# Patient Record
Sex: Female | Born: 1945 | Race: Black or African American | Hispanic: No | Smoking: Current some day smoker
Health system: Southern US, Community
[De-identification: ages and names within clinical notes are randomized; demographics above are authoritative.]

## PROBLEM LIST (undated history)

## (undated) DIAGNOSIS — S22000A Wedge compression fracture of unspecified thoracic vertebra, initial encounter for closed fracture: Secondary | ICD-10-CM

## (undated) DIAGNOSIS — D219 Benign neoplasm of connective and other soft tissue, unspecified: Secondary | ICD-10-CM

## (undated) DIAGNOSIS — F329 Major depressive disorder, single episode, unspecified: Secondary | ICD-10-CM

## (undated) DIAGNOSIS — E119 Type 2 diabetes mellitus without complications: Secondary | ICD-10-CM

## (undated) DIAGNOSIS — R102 Pelvic and perineal pain: Secondary | ICD-10-CM

## (undated) DIAGNOSIS — E049 Nontoxic goiter, unspecified: Secondary | ICD-10-CM

## (undated) DIAGNOSIS — F32A Depression, unspecified: Secondary | ICD-10-CM

## (undated) DIAGNOSIS — I10 Essential (primary) hypertension: Secondary | ICD-10-CM

## (undated) DIAGNOSIS — R1310 Dysphagia, unspecified: Secondary | ICD-10-CM

## (undated) HISTORY — DX: Essential (primary) hypertension: I10

## (undated) HISTORY — DX: Dysphagia, unspecified: R13.10

## (undated) HISTORY — PX: RIGHT OOPHORECTOMY: SHX2359

## (undated) HISTORY — PX: OTHER SURGICAL HISTORY: SHX169

## (undated) HISTORY — DX: Benign neoplasm of connective and other soft tissue, unspecified: D21.9

## (undated) HISTORY — DX: Depression, unspecified: F32.A

## (undated) HISTORY — DX: Type 2 diabetes mellitus without complications: E11.9

## (undated) HISTORY — DX: Wedge compression fracture of unspecified thoracic vertebra, initial encounter for closed fracture: S22.000A

## (undated) HISTORY — DX: Nontoxic goiter, unspecified: E04.9

## (undated) HISTORY — DX: Pelvic and perineal pain: R10.2

## (undated) HISTORY — DX: Major depressive disorder, single episode, unspecified: F32.9

---
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
## 1969-06-18 DIAGNOSIS — S22000A Wedge compression fracture of unspecified thoracic vertebra, initial encounter for closed fracture: Secondary | ICD-10-CM

## 1969-06-18 HISTORY — DX: Wedge compression fracture of unspecified thoracic vertebra, initial encounter for closed fracture: S22.000A

## 1999-10-19 HISTORY — PX: OTHER SURGICAL HISTORY: SHX169

## 2001-03-29 ENCOUNTER — Encounter: Payer: Self-pay | Admitting: Emergency Medicine

## 2001-03-29 ENCOUNTER — Emergency Department (HOSPITAL_COMMUNITY): Admission: EM | Admit: 2001-03-29 | Discharge: 2001-03-29 | Payer: Self-pay | Admitting: Emergency Medicine

## 2001-04-06 ENCOUNTER — Encounter: Payer: Self-pay | Admitting: Orthopedic Surgery

## 2001-04-07 ENCOUNTER — Inpatient Hospital Stay (HOSPITAL_COMMUNITY): Admission: RE | Admit: 2001-04-07 | Discharge: 2001-04-08 | Payer: Self-pay | Admitting: Orthopedic Surgery

## 2001-05-23 ENCOUNTER — Ambulatory Visit (HOSPITAL_COMMUNITY): Admission: RE | Admit: 2001-05-23 | Discharge: 2001-05-23 | Payer: Self-pay | Admitting: Orthopedic Surgery

## 2001-05-23 ENCOUNTER — Encounter: Payer: Self-pay | Admitting: Orthopedic Surgery

## 2003-11-18 ENCOUNTER — Other Ambulatory Visit: Admission: RE | Admit: 2003-11-18 | Discharge: 2003-11-18 | Payer: Self-pay | Admitting: Obstetrics and Gynecology

## 2004-07-20 ENCOUNTER — Emergency Department (HOSPITAL_COMMUNITY): Admission: EM | Admit: 2004-07-20 | Discharge: 2004-07-21 | Payer: Self-pay | Admitting: Emergency Medicine

## 2005-06-08 ENCOUNTER — Emergency Department (HOSPITAL_COMMUNITY): Admission: EM | Admit: 2005-06-08 | Discharge: 2005-06-08 | Payer: Self-pay | Admitting: Emergency Medicine

## 2006-02-10 ENCOUNTER — Encounter: Admission: RE | Admit: 2006-02-10 | Discharge: 2006-02-10 | Payer: Self-pay | Admitting: Family Medicine

## 2006-02-23 ENCOUNTER — Other Ambulatory Visit: Admission: RE | Admit: 2006-02-23 | Discharge: 2006-02-23 | Payer: Self-pay | Admitting: Family Medicine

## 2006-02-23 LAB — HM PAP SMEAR: HM Pap smear: NORMAL

## 2006-02-24 ENCOUNTER — Encounter (HOSPITAL_COMMUNITY): Admission: RE | Admit: 2006-02-24 | Discharge: 2006-05-25 | Payer: Self-pay | Admitting: Family Medicine

## 2006-03-30 ENCOUNTER — Encounter: Admission: RE | Admit: 2006-03-30 | Discharge: 2006-03-30 | Payer: Self-pay | Admitting: Family Medicine

## 2011-06-24 ENCOUNTER — Encounter: Payer: Self-pay | Admitting: Family Medicine

## 2011-11-16 ENCOUNTER — Emergency Department (INDEPENDENT_AMBULATORY_CARE_PROVIDER_SITE_OTHER)
Admission: EM | Admit: 2011-11-16 | Discharge: 2011-11-16 | Disposition: A | Discharge status: Home / Self Care | Payer: Medicare Other | Source: Home / Self Care | Attending: Family Medicine | Admitting: Family Medicine

## 2011-11-16 ENCOUNTER — Encounter (HOSPITAL_COMMUNITY): Payer: Self-pay | Admitting: *Deleted

## 2011-11-16 ENCOUNTER — Other Ambulatory Visit: Payer: Self-pay

## 2011-11-16 ENCOUNTER — Emergency Department (INDEPENDENT_AMBULATORY_CARE_PROVIDER_SITE_OTHER): Payer: Medicare Other

## 2011-11-16 DIAGNOSIS — R079 Chest pain, unspecified: Secondary | ICD-10-CM

## 2011-11-16 DIAGNOSIS — R0789 Other chest pain: Secondary | ICD-10-CM

## 2011-11-16 NOTE — ED Provider Notes (Signed)
Brooke Wilcox is a 66 y.o. female who presents to Urgent Care today for sternal pain. She fell and landed on her thoracic back a few days ago and noted initially mid thoracic back pain and sternal pain. Over the next few days her back pain resolved as did her sternal pain. However she called today and noted resumption of her sternal pain. She notes pain when she presses on her sternum and when she externally rotates both shoulders.  She denies any lightheadedness fevers chills dyspnea palpitations or substernal chest pain or radiating chest pain.   PMH reviewed. Patient is a smoker ROS as above otherwise neg Medications reviewed. No current facility-administered medications for this encounter.   Current Outpatient Prescriptions  Medication Sig Dispense Refill  . gabapentin (NEURONTIN) 300 MG capsule Take 300 mg by mouth 3 (three) times daily.      Marland Kitchen lisinopril (PRINIVIL,ZESTRIL) 20 MG tablet Take 20 mg by mouth daily.      . metFORMIN (GLUCOPHAGE) 500 MG tablet Take 500 mg by mouth 2 (two) times daily with a meal.      (she is not taking any medicines currently)  Exam:  BP 187/149  Pulse 88  Temp(Src) 99.4 F (37.4 C) (Oral)  Resp 16  SpO2 98% Gen: Well NAD HEENT: EOMI,  MMM Lungs: CTABL Nl WOB Chest: Tender to palpation on superior sternum. Ribs nontender. Heart: RRR no MRG Abd: NABS, NT, ND Exts: Non edematous BL  LE, warm and well perfused.   CXR: No new acute findings or fractures EKG: Sinus at 84 bpm with no significant ST segment abnormalities.  Assessment and Plan: 66 year old woman sternal pain following fall.  No acute findings on physical exam or workup. Her pain is likely do to costochondritis type pain.  However she is 74 does smoke and has poorly treated blood pressure.  I strongly encouraged her to followup with her primary care doctor for further evaluation.  If she is not improved I asked her to return back here her primary care doctor or the emergency room.  I  discussed red flag signs or symptoms in detail such as chest pain or trouble breathing. The patient expresses understanding.     [name], MD 11/16/11 2021

## 2011-11-16 NOTE — ED Notes (Signed)
Larey Seat    Down  Some  Steps  A  Few  Days  Ago   She  Has  Pain upper  Back  And      l  Side  Chest  Area        Hurts  On palpation  And  Certain movements           Her bp  Is  Also  High  And she     Has been  Non  complient     With  Her meds

## 2011-11-17 NOTE — ED Provider Notes (Signed)
Medical screening examination/treatment/procedure(s) were performed by PGY-3 FM resident and as supervising physician I was immediately available for consultation/collaboration.   Sharin Grave, MD   Sharin Grave, MD 11/17/11 [phone]

## 2012-11-13 ENCOUNTER — Telehealth: Payer: Self-pay | Admitting: Family Medicine

## 2012-11-13 NOTE — Telephone Encounter (Signed)
Left a message for pt to return call. Additional records MUST be received before appointment. The records received stop at 2007. Pt needs to inform us as to who has been treating her since 2007 and those records need to be ordered. Appointment on Feb 10 must be rescheduled, too many cpe that day.

## 2012-11-22 NOTE — Telephone Encounter (Signed)
PT CALLED AND THEN STOPPED BY TO FILL OUT ADDITIONAL RECORDS REQUEST. REQUEST WAS COMPLETED AND SENT TO PREVIOUS DOC. APPT FOR Monday 11/27/2012 WAS CANCELLED. PT WAS INFORMED THAT WHEN HER RECORDS COME IN WE WILL CALL AND SET UP APPOINTMENT

## 2012-11-23 ENCOUNTER — Encounter: Payer: Self-pay | Admitting: *Deleted

## 2012-11-27 ENCOUNTER — Ambulatory Visit: Payer: Medicare Other | Admitting: Family Medicine

## 2012-12-06 ENCOUNTER — Encounter: Payer: Self-pay | Admitting: *Deleted

## 2012-12-14 ENCOUNTER — Ambulatory Visit (INDEPENDENT_AMBULATORY_CARE_PROVIDER_SITE_OTHER): Payer: Medicare Other | Admitting: Family Medicine

## 2012-12-14 ENCOUNTER — Telehealth: Payer: Self-pay | Admitting: *Deleted

## 2012-12-14 ENCOUNTER — Encounter: Payer: Self-pay | Admitting: Family Medicine

## 2012-12-14 VITALS — BP 214/118 | HR 80 | Ht 62.0 in | Wt 155.0 lb

## 2012-12-14 DIAGNOSIS — Z23 Encounter for immunization: Secondary | ICD-10-CM

## 2012-12-14 DIAGNOSIS — I10 Essential (primary) hypertension: Secondary | ICD-10-CM

## 2012-12-14 DIAGNOSIS — E1149 Type 2 diabetes mellitus with other diabetic neurological complication: Secondary | ICD-10-CM

## 2012-12-14 DIAGNOSIS — N950 Postmenopausal bleeding: Secondary | ICD-10-CM

## 2012-12-14 DIAGNOSIS — E1140 Type 2 diabetes mellitus with diabetic neuropathy, unspecified: Secondary | ICD-10-CM

## 2012-12-14 DIAGNOSIS — E1142 Type 2 diabetes mellitus with diabetic polyneuropathy: Secondary | ICD-10-CM

## 2012-12-14 DIAGNOSIS — IMO0001 Reserved for inherently not codable concepts without codable children: Secondary | ICD-10-CM

## 2012-12-14 LAB — COMPREHENSIVE METABOLIC PANEL
ALT: 13 U/L (ref 0–35)
Albumin: 4.5 g/dL (ref 3.5–5.2)
BUN: 10 mg/dL (ref 6–23)
CO2: 27 mEq/L (ref 19–32)
Creat: 0.6 mg/dL (ref 0.50–1.10)
Glucose, Bld: 244 mg/dL — ABNORMAL HIGH (ref 70–99)
Potassium: 4.3 mEq/L (ref 3.5–5.3)
Total Bilirubin: 0.4 mg/dL (ref 0.3–1.2)
Total Protein: 7.1 g/dL (ref 6.0–8.3)

## 2012-12-14 LAB — CBC WITH DIFFERENTIAL/PLATELET
Basophils Absolute: 0 10*3/uL (ref 0.0–0.1)
HCT: 39.5 % (ref 36.0–46.0)
Lymphs Abs: 3.4 10*3/uL (ref 0.7–4.0)
MCH: 28.7 pg (ref 26.0–34.0)
MCHC: 33.9 g/dL (ref 30.0–36.0)
MCV: 84.6 fL (ref 78.0–100.0)
Monocytes Relative: 5 % (ref 3–12)
Platelets: 290 10*3/uL (ref 150–400)
RBC: 4.67 MIL/uL (ref 3.87–5.11)
RDW: 14.7 % (ref 11.5–15.5)
WBC: 7.7 10*3/uL (ref 4.0–10.5)

## 2012-12-14 LAB — TSH: TSH: 0.621 u[IU]/mL (ref 0.350–4.500)

## 2012-12-14 LAB — POCT GLYCOSYLATED HEMOGLOBIN (HGB A1C): Hemoglobin A1C: 9.6

## 2012-12-14 MED ORDER — METFORMIN HCL 500 MG PO TABS: 1000.0000 mg | ORAL_TABLET | Freq: Two times a day (BID) | ORAL | Status: DC

## 2012-12-14 MED ORDER — PIOGLITAZONE HCL 15 MG PO TABS: 15.0000 mg | ORAL_TABLET | Freq: Every day | ORAL | Status: AC

## 2012-12-14 MED ORDER — LISINOPRIL-HYDROCHLOROTHIAZIDE 20-25 MG PO TABS: 1.0000 | ORAL_TABLET | Freq: Every day | ORAL | Status: DC

## 2012-12-14 MED ORDER — AMLODIPINE BESYLATE 10 MG PO TABS: 10.0000 mg | ORAL_TABLET | Freq: Every day | ORAL | Status: DC

## 2012-12-14 MED ORDER — GABAPENTIN 300 MG PO CAPS: 300.0000 mg | ORAL_CAPSULE | Freq: Three times a day (TID) | ORAL | Status: DC

## 2012-12-14 NOTE — Telephone Encounter (Signed)
Called patient and let her know that she is scheduled with Dr.Morris @ [facility] (GYN) on December 26, 2012 @ 11:40am. They are located at [address]nd floor. Records faxed to [phone].

## 2012-12-14 NOTE — Progress Notes (Signed)
Chief Complaint  Patient presents with  . Establish Care    re-establish care with Dr.Devaney Segers today.   Hasn't seen a doctor in over 2 years (last was in [place], [place]).  Admits to being noncompliant with her meds. She is here today because she is now having some symptoms, so is more motivated to take care of herself.  She reports having a problem swallowing pills, and certain foods.  She had been put on neurontin, which was helping a lot with her neuropathy.  She actually has still been taking this (getting from daughter), with improvement.  neurontin made her a little sleepy--does well with taking 600mg  qHS.  +polyuria and intermittent polydipsia  Drinking a lot of regular Coke, eating chips, eating candy Has never been to an eye doctor Has silver Sneaker program available to her, but hasn't started yet.  Hyperlipidemia.  She reports never taking a statin.  She was rx'd Lovaza, but never started it.  Prefers to take OTC fish oil.  Her life is "a mess right now".  Staying in a room in someone else's house.  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  Unable to work in [place], but still has license in [place] (worked at a bad place that caused a problem for her, "caught up in a mess").  Smoking--started late in life, and is very sporadic.  Sometimes goes weeks without smoking, sometimes smokes all day long.  Has been able to quit smoking in the past when she wanted to.  Past Medical History  Diagnosis Date  . Hypertension   . Diabetes mellitus without complication   . Dysphagia   . Pelvic pain   . Fibroids     uterine  . Depression   . Compression fracture of thoracic vertebra 1970's    T7  . Goiter     left side, per pt, which contributes to her dysphagia   Past Surgical History  Procedure Laterality Date  . Bone chip removal from l knee    . Right oophorectomy    . Right wrist fracture post orif   2001  . Ovarian cyst removal  1960's   History   Social History  . Marital Status:  Divorced    Spouse Name: N/A    Number of Children: 3  . Years of Education: N/A   Occupational History  . unemployed    Social History Main Topics  . Smoking status: Current Some Day Smoker    Types: Cigarettes  . Smokeless tobacco: Not on file     Comment: sporadic smoking, sometimes daily  . Alcohol Use: Yes     Comment: occasional glass of wine.  . Drug Use: No  . Sexually Active: Not on file   Other Topics Concern  . Not on file   Social History Narrative   Divorced.  Staying in a room in someone else's house. Her children all live in [place].  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         Family History  Problem Relation Age of Onset  . Diabetes Mother     borderline  . Breast cancer Mother 51    late 88's  . Hypertension Mother   . Hypothyroidism Mother   . Cancer Father   . Hypertension Brother   . Cancer Daughter     throat cancer  . Lupus Daughter   . Hypertension Brother   . Crohn's disease Sister   .  Hypertension Son   . Diabetes Paternal Aunt   . Cancer Paternal Uncle     lung  . Hypertension Daughter    Current outpatient prescriptions:cholecalciferol (VITAMIN D) 1000 UNITS tablet, Take 1,000 Units by mouth daily., Disp: , Rfl: ;  gabapentin (NEURONTIN) 300 MG capsule, Take 1 capsule (300 mg total) by mouth 3 (three) times daily., Disp: 90 capsule, Rfl: 2;  amLODipine (NORVASC) 10 MG tablet, Take 1 tablet (10 mg total) by mouth daily. Start at 1/2 tablet, and increase to full tablet after a week if BP still >140/90, Disp: 30 tablet, Rfl: 2 lisinopril-hydrochlorothiazide (PRINZIDE,ZESTORETIC) 20-25 MG per tablet, Take 1 tablet by mouth daily., Disp: 30 tablet, Rfl: 2;  metFORMIN (GLUCOPHAGE) 500 MG tablet, Take 2 tablets (1,000 mg total) by mouth 2 (two) times daily with a meal., Disp: 120 tablet, Rfl: 2;  pioglitazone (ACTOS) 15 MG tablet, Take 1 tablet (15 mg total) by mouth daily., Disp: 30 tablet, Rfl: 2  Allergies   Allergen Reactions  . Codeine Hives    And SOB  . Latex Rash    And dizziness.   ROS: Currently denies headache, chest pain. Occasionally gets headaches.  Occasionally gets chest pain, sometimes at rest. Denies GI or GU complaints.  Denies fevers, URI symptoms, cough, shortness of breath. Sometimes has some vaginal bleeding (pink-tinged discharge).  Has known fibroid. Doesn't have a GYN. Denies skin rashes/lesions.  Some numbness/tingling in feet.  She checks feet regularly.  Denies bruising, bleeding (other than tings in vaginal discharge).  PHYSICAL EXAM: BP 220/130  Pulse 80  Ht 5\' 2"  (1.575 m)  Wt 155 lb (70.308 kg)  BMI 28.34 kg/m2 214/118 on repeat by MD Very pleasant, african [place] female, in no distress Wearing tights under pants, declined diabetic foot exam--will do at next visit. HEENT:  PERRL, EOMI, conjunctiva clear.  OP clear.  Neck: no lymphadenopathy, thyromegaly or mass, no carotid bruit Heart: regular rate and rhythm without murmur Lungs: clear bilaterally Abdomen: soft, nontender, no organomegaly or mass Extremities: no edema, 2+ pulse Skin: no rashes (feet not checked) Neuro: alert and oriented.  Cranial nerves grossly intact. Normal gait, sensation.  DTR's 2+ and symmetric Psych: normal mood, affect, hygiene and grooming.  ASSESSMENT/PLAN:  Type II or unspecified type diabetes mellitus without mention of complication, uncontrolled - Plan: Comprehensive metabolic panel, TSH, Microalbumin / creatinine urine ratio, CBC with Differential, metFORMIN (GLUCOPHAGE) 500 MG tablet, pioglitazone (ACTOS) 15 MG tablet, HgB A1c  Uncontrolled hypertension - Plan: Comprehensive metabolic panel, CBC with Differential, lisinopril-hydrochlorothiazide (PRINZIDE,ZESTORETIC) 20-25 MG per tablet, amLODipine (NORVASC) 10 MG tablet  Diabetic neuropathy, type II diabetes mellitus - Plan: gabapentin (NEURONTIN) 300 MG capsule  Postmenopausal bleeding - Plan: Ambulatory referral  to Gynecology  Need for prophylactic vaccination and inoculation against influenza - Plan: Flu vaccine greater than or equal to 3yo preservative free IM  Smoker--risks of smoking reviewed and encouraged to quit.  Advised of available resources  Flu shot today DM--uncontrolled. HTN--uncontrolled. Reviewed longterm risks and complications of untreated hypertension, diabetes.  Advised to schedule yearly diabetic eye exam. Encouraged her to be compliant with her treatment.  Reviewed barriers to treatment, including costs, and trouble swallowing large pills.  Tried to address these barriers.  Advised that if she continues to be noncompliant in her care, that she could potentially be discharged from the clinic.  She seems more motivated to improve health now, but history shows a lot of noncompliance.   Follow a low cholesterol diet.  Since lovaza  was previously prescribed, I suggested trying fish oil would a good idea--3000-4000 mg daily. She isn't fasting today, so lipids not drawn. Stop drinking regular sodas, eating sweets.  Eat a healthy, lowfat diet.  Return in 1 month with a list of blood pressures. Start checking your blood sugars and bring in list to your visit. Return FASTING so that we can check your cholesterol at your visit. Your feet will need to be checked (don't wear tights!) Visit time 45-50 minutes, more than 1/2 spent counseling.  Return in 4-6 weeks for a fasting visit (order lipids at f/u)

## 2012-12-14 NOTE — Patient Instructions (Addendum)
Please quit smoking.  It is helpful to set a quit date.  Utilize the free counseling available (1800QUITNOW or [facility]uit[facility] It is important that you take your medications regularly. Please schedule a diabetic eye exam--need to have yearly if no problems found Please check your blood pressures periodically, write down the numbers on a piece of paper, and bring to your visits with you.     Start lisinopril HCT every day (take in mornings). Start the amlodipine at just 1/2 tablet daily.  After a week if your blood pressure is still over 140/90, then increase to full tablet. Start the pioglitazone (actos) once daily Start back on the metformin--start at 500mg  twice daily prior to breakfast and dinner, and increase weekly by 1 tablet until you are at 1000 mg twice daily (2 tablet twice daily, 4 pills/day).  This should help minimize the stomach side effects.  Continue the neurontin as you have been taking.  You can change it to 1 pill three times daily if you aren't getting adequate relief in taking 2 at bedtime like you have been  Follow a low cholesterol diet.  Since lovaza was previously prescribed, I'm guessing that fish oil would a good idea--3000-4000 mg daily. Stop drinking regular sodas, eating sweets.  Eat a healthy, lowfat diet.  Return in 1 month with a list of blood pressure. Start checking your blood sugars and bring in list to your visit. Return FASTING so that we can check your cholesterol at your visit. Your feet will need to be checked (don't wear tights!)

## 2012-12-15 ENCOUNTER — Encounter: Payer: Self-pay | Admitting: Family Medicine

## 2012-12-15 LAB — MICROALBUMIN / CREATININE URINE RATIO: Microalb Creat Ratio: 13.5 mg/g (ref 0.0–30.0)

## 2013-01-24 ENCOUNTER — Encounter: Payer: Medicare Other | Admitting: Family Medicine

## 2013-01-29 ENCOUNTER — Telehealth: Payer: Self-pay | Admitting: *Deleted

## 2013-01-29 NOTE — Telephone Encounter (Signed)
Patient called and left a message on my voicemail wanting to let you know that she missed her appt last week with you due to a family emergency, she had to unexpectedly go to [place]. She also said that she was referred to see Dr.Grewal by you buy they don't take her insurance so she would like to be referred to Antionette Char at [facility]. I called over to Dr.Grewal's office as I scheduled appointment with Dr.Morris in that same office, wanted to check on the insurance issue. Patient was scheduled with Dr.Morris for 12/26/12 but patient no showed. They do take her insurance also. Do you want me to refer to Dr.Jackson-Moore? Please advise. Also patient r/s'd appt with you from last week to end of July.

## 2013-01-29 NOTE — Telephone Encounter (Signed)
Ok to refer to Dr. Tamela Oddi.  Indiscrepancy of story noted.

## 2013-01-31 ENCOUNTER — Telehealth: Payer: Self-pay | Admitting: *Deleted

## 2013-01-31 NOTE — Telephone Encounter (Signed)
Called patient and left message to let her know that Dr.Jackson-Moore is not taking any new patients except for pregnancy patients. Dr.Harper in the same practice is taking new patients and does take her insurance. Scheduled patient with Dr.Harper for 02/19/13 @ 2:30pm. They are located at [address] Floor, [address] and their phone number is [phone].

## 2013-02-19 ENCOUNTER — Ambulatory Visit (INDEPENDENT_AMBULATORY_CARE_PROVIDER_SITE_OTHER): Payer: Medicare Other | Admitting: Obstetrics

## 2013-02-19 ENCOUNTER — Encounter: Payer: Self-pay | Admitting: Obstetrics

## 2013-02-19 VITALS — BP 148/96 | HR 102 | Temp 98.7°F | Ht 64.0 in | Wt 151.2 lb

## 2013-02-19 DIAGNOSIS — N760 Acute vaginitis: Secondary | ICD-10-CM | POA: Insufficient documentation

## 2013-02-19 DIAGNOSIS — D259 Leiomyoma of uterus, unspecified: Secondary | ICD-10-CM

## 2013-02-19 DIAGNOSIS — Z1231 Encounter for screening mammogram for malignant neoplasm of breast: Secondary | ICD-10-CM

## 2013-02-19 DIAGNOSIS — Z0000 Encounter for general adult medical examination without abnormal findings: Secondary | ICD-10-CM

## 2013-02-19 DIAGNOSIS — Z1211 Encounter for screening for malignant neoplasm of colon: Secondary | ICD-10-CM

## 2013-02-19 DIAGNOSIS — Z01419 Encounter for gynecological examination (general) (routine) without abnormal findings: Secondary | ICD-10-CM

## 2013-02-19 NOTE — Progress Notes (Signed)
.   Subjective:     Brooke Wilcox is a 67 y.o. female here for a routine exam.  Current complaints: pt states that she had a little spotting after douching. She has a history of fibroids.  Personal health questionnaire reviewed: yes.   Gynecologic History No LMP recorded. Patient is postmenopausal. Contraception: none Last Pap: 2010. Results were: normal Last mammogram: 2010. Results were normal.  Obstetric History OB History   Grav Para Term Preterm Abortions TAB SAB Ect Mult Living                   The following portions of the patient's history were reviewed and updated as appropriate: allergies, current medications, past family history, past medical history, past social history, past surgical history and problem list.  Review of Systems Pertinent items are noted in HPI.    Objective:    General appearance: alert and no distress Breasts: normal appearance, no masses or tenderness Abdomen: normal findings: soft, non-tender Pelvic: NEFG.  Vagina with frothy, pinkish discharge.  Cervix stenosed os, friable and bled easily with pap.  Uterus difficult to appreciate due to obesity.  Adnexa negative for masses or tenderness.   Assessment:    Annual exam Trichomonas vaginalis   Plan:    Education reviewed: self breast exams, weight bearing exercise and Trichomanas.. Mammogram ordered. Follow up in: 2 weeks. Postmenopausal bleeding probably related to Trichomonas infecton   Tindamax Rx.

## 2013-02-20 NOTE — Patient Instructions (Addendum)
Trichomonas Fibroids

## 2013-02-27 ENCOUNTER — Encounter: Payer: Self-pay | Admitting: Family Medicine

## 2013-03-02 ENCOUNTER — Encounter: Payer: Self-pay | Admitting: Gastroenterology

## 2013-03-05 ENCOUNTER — Telehealth: Payer: Self-pay | Admitting: Internal Medicine

## 2013-03-05 DIAGNOSIS — I10 Essential (primary) hypertension: Secondary | ICD-10-CM

## 2013-03-05 DIAGNOSIS — E1140 Type 2 diabetes mellitus with diabetic neuropathy, unspecified: Secondary | ICD-10-CM

## 2013-03-05 MED ORDER — METFORMIN HCL 500 MG PO TABS: 1000.0000 mg | ORAL_TABLET | Freq: Two times a day (BID) | ORAL | Status: AC

## 2013-03-05 MED ORDER — GABAPENTIN 300 MG PO CAPS: 300.0000 mg | ORAL_CAPSULE | Freq: Three times a day (TID) | ORAL | Status: DC

## 2013-03-05 MED ORDER — AMLODIPINE BESYLATE 10 MG PO TABS: 10.0000 mg | ORAL_TABLET | Freq: Every day | ORAL | Status: AC

## 2013-03-05 MED ORDER — LISINOPRIL-HYDROCHLOROTHIAZIDE 20-25 MG PO TABS: 1.0000 | ORAL_TABLET | Freq: Every day | ORAL | Status: AC

## 2013-03-05 NOTE — Telephone Encounter (Signed)
Done

## 2013-03-05 NOTE — Telephone Encounter (Signed)
Pt needs metformin,lisinopril-hctz, gabpentin, and norvasc all sent to her new pharmacy [facility]

## 2013-03-13 ENCOUNTER — Other Ambulatory Visit: Payer: Self-pay | Admitting: Obstetrics

## 2013-03-13 ENCOUNTER — Ambulatory Visit (HOSPITAL_COMMUNITY)
Admission: RE | Admit: 2013-03-13 | Discharge: 2013-03-13 | Disposition: A | Discharge status: Home / Self Care | Payer: Medicare Other | Source: Ambulatory Visit | Attending: Obstetrics | Admitting: Obstetrics

## 2013-03-13 DIAGNOSIS — D259 Leiomyoma of uterus, unspecified: Secondary | ICD-10-CM

## 2013-03-13 DIAGNOSIS — R9389 Abnormal findings on diagnostic imaging of other specified body structures: Secondary | ICD-10-CM | POA: Insufficient documentation

## 2013-03-13 DIAGNOSIS — Z780 Asymptomatic menopausal state: Secondary | ICD-10-CM | POA: Insufficient documentation

## 2013-03-19 ENCOUNTER — Ambulatory Visit (INDEPENDENT_AMBULATORY_CARE_PROVIDER_SITE_OTHER): Payer: Medicare Other | Admitting: Family Medicine

## 2013-03-19 ENCOUNTER — Encounter: Payer: Self-pay | Admitting: Family Medicine

## 2013-03-19 VITALS — BP 136/80 | HR 80 | Ht 62.0 in | Wt 149.0 lb

## 2013-03-19 DIAGNOSIS — E785 Hyperlipidemia, unspecified: Secondary | ICD-10-CM

## 2013-03-19 DIAGNOSIS — I10 Essential (primary) hypertension: Secondary | ICD-10-CM

## 2013-03-19 DIAGNOSIS — E1142 Type 2 diabetes mellitus with diabetic polyneuropathy: Secondary | ICD-10-CM

## 2013-03-19 DIAGNOSIS — E1140 Type 2 diabetes mellitus with diabetic neuropathy, unspecified: Secondary | ICD-10-CM

## 2013-03-19 DIAGNOSIS — E1149 Type 2 diabetes mellitus with other diabetic neurological complication: Secondary | ICD-10-CM

## 2013-03-19 DIAGNOSIS — E781 Pure hyperglyceridemia: Secondary | ICD-10-CM

## 2013-03-19 DIAGNOSIS — Z79899 Other long term (current) drug therapy: Secondary | ICD-10-CM

## 2013-03-19 LAB — LIPID PANEL
Cholesterol: 286 mg/dL — ABNORMAL HIGH (ref 0–200)
HDL: 42 mg/dL (ref >39)
Total CHOL/HDL Ratio: 6.8 Ratio
Triglycerides: 688 mg/dL — ABNORMAL HIGH (ref <150)

## 2013-03-19 LAB — BASIC METABOLIC PANEL
BUN: 16 mg/dL (ref 6–23)
CO2: 27 mEq/L (ref 19–32)
Calcium: 9.7 mg/dL (ref 8.4–10.5)
Creat: 0.78 mg/dL (ref 0.50–1.10)
Glucose, Bld: 264 mg/dL — ABNORMAL HIGH (ref 70–99)

## 2013-03-19 LAB — POCT GLYCOSYLATED HEMOGLOBIN (HGB A1C): Hemoglobin A1C: 11.1

## 2013-03-19 NOTE — Patient Instructions (Addendum)
Start taking 1 in the morning, and 2 with dinner.  After a week, if feeling okay, increase to 2 tablets twice daily.  If after a week, your sugars are still >140-150 fasting, then add the Actos in.   Hopefully you will tolerate a more gradual increase in starting the diabetes medications.  Continue to monitor sugars regularly.  Work on cutting back on sodas and sweets and carbs in diet.  Try and exercise daily which also keeps sugars down.  The risks of poorly controlled diabetes are worse than the potential risks with Actos.  There are other meds which are more expensive, but I think this is a reasonable choice for treatment (using Actos in addition to Metformin).   Continue to monitor blood pressure.  Bring list of sugars and blood pressures to all visits.  Schedule diabetic eye exam.

## 2013-03-19 NOTE — Progress Notes (Signed)
Chief Complaint  Patient presents with  . Diabetes    fasting med check. Never took actos, did fill rx but became scared when she saw commercial on tv about the medication.    Patient presents to follow up on her blood pressure and diabetes.   Hypertension follow-up:  Blood pressures elsewhere are 120's-130's/85-90. She didn't remember to bring in list of BP's, this was her recollection.  Denies dizziness, headaches, chest pain.  Denies side effects of medications.  Only occasional cough.  Diabetes follow-up:  Blood sugars at home are running 170-212.  Only occasionally has gotten it around 140. States she almost passed out a couple of times, felt like sugar was low.  Eating candy helped her feel better.  She was out, and didn't have machine with her to check sugars when symptomatic. Denies polydipsia and polyuria.  Eye exam is still not yet scheduled.  Patient follows a low sugar diet and checks feet regularly without concerns.  She states she has been eating healthier.  Cutting back on drinking regular sodas.  She is taking only metformin 500mg  BID (not 1000mg  as instructed), and feels better--felt too washed out on the 1000mg  dose.  She never started the Actos--afraid of risks (commercial on TV). "I'm getting more compliant" Hasn't started exercise program just yet.  Neuropathy is stable, improved with neurontin 3/day.  Past Medical History  Diagnosis Date  . Hypertension   . Diabetes mellitus without complication   . Dysphagia   . Pelvic pain   . Fibroids     uterine  . Depression   . Compression fracture of thoracic vertebra 1970's    T7  . Goiter     left side, per pt, which contributes to her dysphagia   Past Surgical History  Procedure Laterality Date  . Bone chip removal from l knee    . Right oophorectomy    . Right wrist fracture post orif   2001  . Ovarian cyst removal  1960's   History   Social History  . Marital Status: Divorced    Spouse Name: N/A    Number  of Children: 3  . Years of Education: N/A   Occupational History  . unemployed    Social History Main Topics  . Smoking status: Current Some Day Smoker    Types: Cigarettes  . Smokeless tobacco: Not on file     Comment: sporadic smoking, sometimes daily  . Alcohol Use: Yes     Comment: occasional glass of wine.  . Drug Use: No  . Sexually Active: Not on file   Other Topics Concern  . Not on file   Social History Narrative   Divorced.  Staying in a room in someone else's house. Her children all live in [place].  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         Current Outpatient Prescriptions on File Prior to Visit  Medication Sig Dispense Refill  . amLODipine (NORVASC) 10 MG tablet Take 1 tablet (10 mg total) by mouth daily. Start at 1/2 tablet, and increase to full tablet after a week if BP still >140/90  90 tablet  0  . cholecalciferol (VITAMIN D) 1000 UNITS tablet Take 1,000 Units by mouth daily.      Marland Kitchen gabapentin (NEURONTIN) 300 MG capsule Take 1 capsule (300 mg total) by mouth 3 (three) times daily.  270 capsule  0  . lisinopril-hydrochlorothiazide (PRINZIDE,ZESTORETIC) 20-25 MG per tablet Take 1 tablet  by mouth daily.  90 tablet  0  . metFORMIN (GLUCOPHAGE) 500 MG tablet Take 2 tablets (1,000 mg total) by mouth 2 (two) times daily with a meal.  360 tablet  0  . pioglitazone (ACTOS) 15 MG tablet Take 1 tablet (15 mg total) by mouth daily.  30 tablet  2   No current facility-administered medications on file prior to visit.   (only taking 500mg  BID of metformin, NOT taking Actos)  Allergies  Allergen Reactions  . Codeine Hives    And SOB  . Latex Rash    And dizziness.   ROS:  Denies headaches, dizziness, chest pain, edema, myalgias, URI symptoms, shortness of breath, fevers, urinary complaints, myalgias, depression or other concerns.  See HPI.  Neuropathy improved.  PHYSICAL EXAM: BP 132/90  Pulse 80  Ht 5\' 2"  (1.575 m)  Wt 149 lb  (67.586 kg)  BMI 27.25 kg/m2 136/80 on repeat by MD, RA Well developed, pleasant female in no distress Neck: no lymphadenopathy, thyromegaly or carotid bruit Heart: regular rate and rhythm without murmur Lungs: clear bilaterally Abdomen: soft, nontender Extremities: no edema, 2+ pulse.   Normal diabetic foot exam Neuro: alert and oriented.  CN grossly intact. Normal gait, strength, sensation Psych: normal mood, affect, hygiene and grooming  Lab Results  Component Value Date   HGBA1C 11.1 03/19/2013   ASSESSMENT/PLAN: Type II or unspecified type diabetes mellitus without mention of complication, uncontrolled - Plan: HgB A1c, Basic metabolic panel, HM Diabetes Foot Exam  Dyslipidemia - Plan: Lipid panel  Encounter for long-term (current) use of other medications - Plan: Basic metabolic panel  Essential hypertension, benign  Diabetic neuropathy, type II diabetes mellitus  Pure hyperglyceridemia  HTN--improved control.  Continue to monitor, still borderline. Uncontrolled diabetes. Likely is feeling bad when sugars are still within normal range (suspect symptomatic at 100 range), doubt truly having hypoglycemia given her A1c and noncompliance with meds.  Feeling better since dose cut back, but clearly needs better control of diabetes.  Will increase dose more gradually.  Start taking 3/day of metformin for 1 week, then if able, increase to 4/day (2 twice daily).  If tolerating, and sugars are still >140-150 fasting, then start the Actos (which I suspect she will need to do).  Hopefully you will tolerate a more gradual increase in starting the diabetes medications.  Continue to monitor sugars regularly.  Work on cutting back on sodas and sweets and carbs in diet.  Try and exercise daily which also keeps sugars down.  The risks of poorly controlled diabetes are worse than the potential risks with Actos.  There are other meds which are more expensive, but I think this is a reasonable choice  for treatment (using Actos in addition to Metformin).  Scheduled for colonoscopy. Reminded to schedule ophtho

## 2013-03-20 DIAGNOSIS — E781 Pure hyperglyceridemia: Secondary | ICD-10-CM | POA: Insufficient documentation

## 2013-03-22 NOTE — Progress Notes (Signed)
Quick Note:  PT WAS INFORMED WORD FOR WORD AND WANTED TO TRY THE PLAN THAT SHE WANTED TO TRY THAT BEFORE SHE WENT ON ANY OTHER MEDICATION SHE SAID SHE HAD TAKEN LOVAZA BEFORE ______

## 2013-03-28 ENCOUNTER — Other Ambulatory Visit: Payer: Self-pay | Admitting: *Deleted

## 2013-03-28 DIAGNOSIS — E781 Pure hyperglyceridemia: Secondary | ICD-10-CM

## 2013-03-28 DIAGNOSIS — IMO0001 Reserved for inherently not codable concepts without codable children: Secondary | ICD-10-CM

## 2013-04-10 ENCOUNTER — Ambulatory Visit: Payer: Medicare Other | Admitting: Obstetrics

## 2013-04-12 ENCOUNTER — Ambulatory Visit (HOSPITAL_COMMUNITY): Payer: Medicare Other | Attending: Obstetrics

## 2013-04-24 ENCOUNTER — Encounter: Payer: Medicare Other | Admitting: Gastroenterology

## 2013-05-14 ENCOUNTER — Encounter: Payer: Self-pay | Admitting: Family Medicine

## 2013-06-21 ENCOUNTER — Other Ambulatory Visit: Payer: Self-pay

## 2013-06-25 ENCOUNTER — Encounter: Payer: Medicare Other | Admitting: Family Medicine

## 2013-07-13 ENCOUNTER — Encounter: Payer: Medicare Other | Admitting: Family Medicine

## 2013-09-04 ENCOUNTER — Encounter: Payer: Self-pay | Admitting: Family Medicine

## 2013-09-05 ENCOUNTER — Encounter: Payer: Self-pay | Admitting: Family Medicine

## 2013-09-07 ENCOUNTER — Telehealth: Payer: Self-pay | Admitting: Family Medicine

## 2013-09-07 ENCOUNTER — Encounter: Payer: Self-pay | Admitting: *Deleted

## 2013-09-10 ENCOUNTER — Telehealth: Payer: Self-pay | Admitting: Family Medicine

## 2013-09-10 NOTE — Telephone Encounter (Signed)
Returned called to pt reached voice mail.  Left message

## 2013-09-10 NOTE — Telephone Encounter (Signed)
Rcd note that patient wanted to discuss discharge letter.  Phoned pt. Reached voice mail. Left message to return call.

## 2014-03-12 ENCOUNTER — Encounter: Payer: Self-pay | Admitting: Family

## 2014-03-12 ENCOUNTER — Ambulatory Visit (INDEPENDENT_AMBULATORY_CARE_PROVIDER_SITE_OTHER): Payer: Medicare Other | Admitting: Family

## 2014-03-12 DIAGNOSIS — E049 Nontoxic goiter, unspecified: Secondary | ICD-10-CM

## 2014-03-12 DIAGNOSIS — E1140 Type 2 diabetes mellitus with diabetic neuropathy, unspecified: Secondary | ICD-10-CM

## 2014-03-12 DIAGNOSIS — E1165 Type 2 diabetes mellitus with hyperglycemia: Principal | ICD-10-CM

## 2014-03-12 DIAGNOSIS — I10 Essential (primary) hypertension: Secondary | ICD-10-CM

## 2014-03-12 DIAGNOSIS — E1142 Type 2 diabetes mellitus with diabetic polyneuropathy: Secondary | ICD-10-CM

## 2014-03-12 DIAGNOSIS — M25569 Pain in unspecified knee: Secondary | ICD-10-CM

## 2014-03-12 DIAGNOSIS — E1149 Type 2 diabetes mellitus with other diabetic neurological complication: Secondary | ICD-10-CM

## 2014-03-12 DIAGNOSIS — IMO0001 Reserved for inherently not codable concepts without codable children: Secondary | ICD-10-CM

## 2014-03-12 LAB — CBC WITH DIFFERENTIAL/PLATELET
BASOS ABS: 0 10*3/uL (ref 0.0–0.1)
Basophils Relative: 0.5 % (ref 0.0–3.0)
EOS ABS: 0.1 10*3/uL (ref 0.0–0.7)
Eosinophils Relative: 1.8 % (ref 0.0–5.0)
HCT: 38.9 % (ref 36.0–46.0)
HEMOGLOBIN: 12.7 g/dL (ref 12.0–15.0)
LYMPHS ABS: 3.4 10*3/uL (ref 0.7–4.0)
LYMPHS PCT: 47.8 % — AB (ref 12.0–46.0)
MCHC: 32.7 g/dL (ref 30.0–36.0)
MCV: 88.6 fl (ref 78.0–100.0)
Monocytes Absolute: 0.4 10*3/uL (ref 0.1–1.0)
Monocytes Relative: 5.8 % (ref 3.0–12.0)
NEUTROS ABS: 3.1 10*3/uL (ref 1.4–7.7)
Neutrophils Relative %: 44.1 % (ref 43.0–77.0)
PLATELETS: 309 10*3/uL (ref 150.0–400.0)
RBC: 4.39 Mil/uL (ref 3.87–5.11)
RDW: 14.3 % (ref 11.5–15.5)
WBC: 7 10*3/uL (ref 4.0–10.5)

## 2014-03-12 LAB — HEPATIC FUNCTION PANEL
ALT: 16 U/L (ref 0–35)
AST: 13 U/L (ref 0–37)
Albumin: 4 g/dL (ref 3.5–5.2)
Alkaline Phosphatase: 48 U/L (ref 39–117)
BILIRUBIN DIRECT: 0 mg/dL (ref 0.0–0.3)
BILIRUBIN TOTAL: 0.7 mg/dL (ref 0.2–1.2)
Total Protein: 6.9 g/dL (ref 6.0–8.3)

## 2014-03-12 LAB — BASIC METABOLIC PANEL
BUN: 10 mg/dL (ref 6–23)
CO2: 25 mEq/L (ref 19–32)
CREATININE: 0.7 mg/dL (ref 0.4–1.2)
Calcium: 9.1 mg/dL (ref 8.4–10.5)
Chloride: 101 mEq/L (ref 96–112)
GFR: 114.61 mL/min (ref >60.00)
Glucose, Bld: 226 mg/dL — ABNORMAL HIGH (ref 70–99)
POTASSIUM: 3.6 meq/L (ref 3.5–5.1)
Sodium: 138 mEq/L (ref 135–145)

## 2014-03-12 LAB — T3, FREE: T3 FREE: 2.8 pg/mL (ref 2.3–4.2)

## 2014-03-12 LAB — T4, FREE: Free T4: 0.81 ng/dL (ref 0.60–1.60)

## 2014-03-12 LAB — TSH: TSH: 0.51 u[IU]/mL (ref 0.35–4.50)

## 2014-03-12 LAB — HEMOGLOBIN A1C: HEMOGLOBIN A1C: 11.3 % — AB (ref 4.6–6.5)

## 2014-03-12 MED ORDER — GABAPENTIN 300 MG PO CAPS: 300.0000 mg | ORAL_CAPSULE | Freq: Three times a day (TID) | ORAL | Status: AC

## 2014-03-12 NOTE — Patient Instructions (Signed)
Hypertension As your heart beats, it forces blood through your arteries. This force is your blood pressure. If the pressure is too high, it is called hypertension (HTN) or high blood pressure. HTN is dangerous because you may have it and not know it. High blood pressure may mean that your heart has to work harder to pump blood. Your arteries may be narrow or stiff. The extra work puts you at risk for heart disease, stroke, and other problems.  Blood pressure consists of two numbers, a higher number over a lower, 110/72, for example. It is stated as "110 over 72." The ideal is below 120 for the top number (systolic) and under 80 for the bottom (diastolic). Write down your blood pressure today. You should pay close attention to your blood pressure if you have certain conditions such as:  Heart failure.  Prior heart attack.  Diabetes  Chronic kidney disease.  Prior stroke.  Multiple risk factors for heart disease. To see if you have HTN, your blood pressure should be measured while you are seated with your arm held at the level of the heart. It should be measured at least twice. A one-time elevated blood pressure reading (especially in the Emergency Department) does not mean that you need treatment. There may be conditions in which the blood pressure is different between your right and left arms. It is important to see your caregiver soon for a recheck. Most people have essential hypertension which means that there is not a specific cause. This type of high blood pressure may be lowered by changing lifestyle factors such as:  Stress.  Smoking.  Lack of exercise.  Excessive weight.  Drug/tobacco/alcohol use.  Eating less salt. Most people do not have symptoms from high blood pressure until it has caused damage to the body. Effective treatment can often prevent, delay or reduce that damage. TREATMENT  When a cause has been identified, treatment for high blood pressure is directed at the  cause. There are a large number of medications to treat HTN. These fall into several categories, and your caregiver will help you select the medicines that are best for you. Medications may have side effects. You should review side effects with your caregiver. If your blood pressure stays high after you have made lifestyle changes or started on medicines,   Your medication(s) may need to be changed.  Other problems may need to be addressed.  Be certain you understand your prescriptions, and know how and when to take your medicine.  Be sure to follow up with your caregiver within the time frame advised (usually within two weeks) to have your blood pressure rechecked and to review your medications.  If you are taking more than one medicine to lower your blood pressure, make sure you know how and at what times they should be taken. Taking two medicines at the same time can result in blood pressure that is too low. SEEK IMMEDIATE MEDICAL CARE IF:  You develop a severe headache, blurred or changing vision, or confusion.  You have unusual weakness or numbness, or a faint feeling.  You have severe chest or abdominal pain, vomiting, or breathing problems. MAKE SURE YOU:   Understand these instructions.  Will watch your condition.  Will get help right away if you are not doing well or get worse. Document Released: 10/04/2005 Document Revised: 12/27/2011 Document Reviewed: 05/24/2008 [facility] Patient Information 2014 [place]. Diabetes and Foot Care Diabetes may cause you to have problems because of poor blood supply (  circulation) to your feet and legs. This may cause the skin on your feet to become thinner, break easier, and heal more slowly. Your skin may become dry, and the skin may peel and crack. You may also have nerve damage in your legs and feet causing decreased feeling in them. You may not notice minor injuries to your feet that could lead to infections or more serious problems.  Taking care of your feet is one of the most important things you can do for yourself.  HOME CARE INSTRUCTIONS  Wear shoes at all times, even in the house. Do not go barefoot. Bare feet are easily injured.  Check your feet daily for blisters, cuts, and redness. If you cannot see the bottom of your feet, use a mirror or ask someone for help.  Wash your feet with warm water (do not use hot water) and mild soap. Then pat your feet and the areas between your toes until they are completely dry. Do not soak your feet as this can dry your skin.  Apply a moisturizing lotion or petroleum jelly (that does not contain alcohol and is unscented) to the skin on your feet and to dry, brittle toenails. Do not apply lotion between your toes.  Trim your toenails straight across. Do not dig under them or around the cuticle. File the edges of your nails with an emery board or nail file.  Do not cut corns or calluses or try to remove them with medicine.  Wear clean socks or stockings every day. Make sure they are not too tight. Do not wear knee-high stockings since they may decrease blood flow to your legs.  Wear shoes that fit properly and have enough cushioning. To break in new shoes, wear them for just a few hours a day. This prevents you from injuring your feet. Always look in your shoes before you put them on to be sure there are no objects inside.  Do not cross your legs. This may decrease the blood flow to your feet.  If you find a minor scrape, cut, or break in the skin on your feet, keep it and the skin around it clean and dry. These areas may be cleansed with mild soap and water. Do not cleanse the area with peroxide, alcohol, or iodine.  When you remove an adhesive bandage, be sure not to damage the skin around it.  If you have a wound, look at it several times a day to make sure it is healing.  Do not use heating pads or hot water bottles. They may burn your skin. If you have lost feeling in your  feet or legs, you may not know it is happening until it is too late.  Make sure your health care provider performs a complete foot exam at least annually or more often if you have foot problems. Report any cuts, sores, or bruises to your health care provider immediately. SEEK MEDICAL CARE IF:   You have an injury that is not healing.  You have cuts or breaks in the skin.  You have an ingrown nail.  You notice redness on your legs or feet.  You feel burning or tingling in your legs or feet.  You have pain or cramps in your legs and feet.  Your legs or feet are numb.  Your feet always feel cold. SEEK IMMEDIATE MEDICAL CARE IF:   There is increasing redness, swelling, or pain in or around a wound.  There is a red line that  goes up your leg.  Pus is coming from a wound.  You develop a fever or as directed by your health care provider.  You notice a bad smell coming from an ulcer or wound. Document Released: 10/01/2000 Document Revised: 06/06/2013 Document Reviewed: 03/13/2013 [facility] Patient Information 2014 [place]. Diabetes and Exercise Exercising regularly is important. It is not just about losing weight. It has many health benefits, such as:  Improving your overall fitness, flexibility, and endurance.  Increasing your bone density.  Helping with weight control.  Decreasing your body fat.  Increasing your muscle strength.  Reducing stress and tension.  Improving your overall health. People with diabetes who exercise gain additional benefits because exercise:  Reduces appetite.  Improves the body's use of blood sugar (glucose).  Helps lower or control blood glucose.  Decreases blood pressure.  Helps control blood lipids (such as cholesterol and triglycerides).  Improves the body's use of the hormone insulin by:  Increasing the body's insulin sensitivity.  Reducing the body's insulin needs.  Decreases the risk for heart disease because  exercising:  Lowers cholesterol and triglycerides levels.  Increases the levels of good cholesterol (such as high-density lipoproteins [HDL]) in the body.  Lowers blood glucose levels. YOUR ACTIVITY PLAN  Choose an activity that you enjoy and set realistic goals. Your health care provider or diabetes educator can help you make an activity plan that works for you. You can break activities into 2 or 3 sessions throughout the day. Doing so is as good as one long session. Exercise ideas include:  Taking the dog for a walk.  Taking the stairs instead of the elevator.  Dancing to your favorite song.  Doing your favorite exercise with a friend. RECOMMENDATIONS FOR EXERCISING WITH TYPE 1 OR TYPE 2 DIABETES   Check your blood glucose before exercising. If blood glucose levels are greater than 240 mg/dL, check for urine ketones. Do not exercise if ketones are present.  Avoid injecting insulin into areas of the body that are going to be exercised. For example, avoid injecting insulin into:  The arms when playing tennis.  The legs when jogging.  Keep a record of:  Food intake before and after you exercise.  Expected peak times of insulin action.  Blood glucose levels before and after you exercise.  The type and amount of exercise you have done.  Review your records with your health care provider. Your health care provider will help you to develop guidelines for adjusting food intake and insulin amounts before and after exercising.  If you take insulin or oral hypoglycemic agents, watch for signs and symptoms of hypoglycemia. They include:  Dizziness.  Shaking.  Sweating.  Chills.  Confusion.  Drink plenty of water while you exercise to prevent dehydration or heat stroke. Body water is lost during exercise and must be replaced.  Talk to your health care provider before starting an exercise program to make sure it is safe for you. Remember, almost any type of activity is better  than none. Document Released: 12/25/2003 Document Revised: 06/06/2013 Document Reviewed: 03/13/2013 [facility] Patient Information 2014 [place].

## 2014-03-12 NOTE — Progress Notes (Signed)
Subjective:    Patient ID: Brooke Wilcox, female    DOB: 09-26-1946, 68 y.o.   MRN: [id number]  HPI  She presents today for new patient work up. Her history is significant for hypertension and T2DM.  Acute issues include: (1) L lateral thigh pain x 2 weeks intermittent throughout the day, described as burning sensation to the knee. She is also experiencing intermittent L knee swelling. She has tried Naproxen 200 mg at night with some relief. Denies changes in gait.  (2) difficulty swallowing pills; this is a daily issue and the problem is interfering with her medication compliance. Hx is significant for barium swallow study 10 years ago; no acute findings at that time. She also describes a known thyroid enlargement; denies history of abnormal TSH values.  Review of Systems  Constitutional: Negative.   HENT: Negative.   Eyes: Negative.   Respiratory: Negative.   Cardiovascular: Positive for palpitations and leg swelling.  Gastrointestinal: Negative.   Endocrine: Negative.   Genitourinary: Negative.   Musculoskeletal: Positive for joint swelling.  Skin: Negative.   Neurological: Negative.   Hematological: Negative.   Psychiatric/Behavioral: Negative.     Past Medical History  Diagnosis Date  . Hypertension   . Diabetes mellitus without complication   . Dysphagia   . Pelvic pain   . Fibroids     uterine  . Depression   . Compression fracture of thoracic vertebra 1970's    T7  . Goiter     left side, per pt, which contributes to her dysphagia    History   Social History  . Marital Status: Divorced    Spouse Name: N/A    Number of Children: 3  . Years of Education: N/A   Occupational History  . unemployed    Social History Main Topics  . Smoking status: Current Some Day Smoker    Types: Cigarettes  . Smokeless tobacco: Not on file     Comment: sporadic smoking, sometimes daily  . Alcohol Use: Yes     Comment: occasional glass of wine.  . Drug Use: No  .  Sexual Activity: Not on file   Other Topics Concern  . Not on file   Social History Narrative   Divorced.  Staying in a room in someone else's house. Her children all live in [place].  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          Past Surgical History  Procedure Laterality Date  . Bone chip removal from l knee    . Right oophorectomy    . Right wrist fracture post orif   2001  . Ovarian cyst removal  1960's    Family History  Problem Relation Age of Onset  . Diabetes Mother     borderline  . Breast cancer Mother 49    late 45's  . Hypertension Mother   . Hypothyroidism Mother   . Cancer Father   . Hypertension Brother   . Cancer Daughter     throat cancer  . Lupus Daughter   . Hypertension Brother   . Crohn's disease Sister   . Hypertension Son   . Diabetes Paternal Aunt   . Cancer Paternal Uncle     lung  . Hypertension Daughter     Allergies  Allergen Reactions  . Codeine Hives    And SOB  . Latex Rash    And dizziness.    Current Outpatient Prescriptions on File  Prior to Visit  Medication Sig Dispense Refill  . amLODipine (NORVASC) 10 MG tablet Take 1 tablet (10 mg total) by mouth daily. Start at 1/2 tablet, and increase to full tablet after a week if BP still >140/90  90 tablet  0  . cholecalciferol (VITAMIN D) 1000 UNITS tablet Take 1,000 Units by mouth daily.      Marland Kitchen gabapentin (NEURONTIN) 300 MG capsule Take 1 capsule (300 mg total) by mouth 3 (three) times daily.  270 capsule  0  . lisinopril-hydrochlorothiazide (PRINZIDE,ZESTORETIC) 20-25 MG per tablet Take 1 tablet by mouth daily.  90 tablet  0  . metFORMIN (GLUCOPHAGE) 500 MG tablet Take 2 tablets (1,000 mg total) by mouth 2 (two) times daily with a meal.  360 tablet  0  . pioglitazone (ACTOS) 15 MG tablet Take 1 tablet (15 mg total) by mouth daily.  30 tablet  2   No current facility-administered medications on file prior to visit.   HTN: she is taking  Zestoretic and amlodipine irregularly. Does not monitor BP at home. She reports infrequent HAs associated with missed HTN medications. Denies epistaxis, flushing. Reports bilateral LLE edema. Regular diet; no routine exercise.  T2DM: changed her dosing for Metformin to 1000 mg at night. She was experiencing hypoglycemic episodes with 1000 mg morning dose. Since altering the dosing, she denies hypoglycemic events. She does not monitor CBGs at home. Last exam more than 5 years ago. No foot exam to date.     Objective:   Physical Exam  Constitutional: She is oriented to person, place, and time. She appears well-developed and well-nourished.  HENT:  Head: Normocephalic and atraumatic.  Eyes: EOM are normal. Pupils are equal, round, and reactive to light.  Neck: Thyromegaly present.  Cardiovascular: Normal rate, regular rhythm, normal heart sounds and intact distal pulses.   Pulmonary/Chest: Effort normal and breath sounds normal.  Abdominal: Soft. Bowel sounds are normal.  Musculoskeletal: She exhibits edema. She exhibits no tenderness.  Bilateral LLE trace/+1   Neurological: She is alert and oriented to person, place, and time.  Skin: Skin is warm and dry.  Psychiatric: She has a normal mood and affect. Her behavior is normal. Judgment and thought content normal.      Assessment & Plan:  Ryian was seen today for no specified reason.  Diagnoses and associated orders for this visit:  Type II or unspecified type diabetes mellitus without mention of complication, uncontrolled - Hemoglobin A1c - Hepatic Function Panel  Unspecified essential hypertension - CBC with Differential - Basic Metabolic Panel - Hepatic Function Panel  Diabetic neuropathy - TSH  Pain in joint, lower leg  Enlarged thyroid - TSH - T3, Free - T4, Free - US Soft Tissue Head/Neck; Future - DG Esophagus; Future  Diabetic neuropathy, type II diabetes mellitus - gabapentin (NEURONTIN) 300 MG capsule; Take 1  capsule (300 mg total) by mouth 3 (three) times daily.

## 2014-03-13 ENCOUNTER — Ambulatory Visit (HOSPITAL_COMMUNITY)
Admission: RE | Admit: 2014-03-13 | Discharge: 2014-03-13 | Disposition: A | Discharge status: Home / Self Care | Payer: Medicare Other | Source: Ambulatory Visit | Attending: Family | Admitting: Family

## 2014-03-13 ENCOUNTER — Telehealth: Payer: Self-pay | Admitting: Family

## 2014-03-13 ENCOUNTER — Other Ambulatory Visit: Payer: Self-pay | Admitting: Family

## 2014-03-13 DIAGNOSIS — K224 Dyskinesia of esophagus: Secondary | ICD-10-CM | POA: Insufficient documentation

## 2014-03-13 DIAGNOSIS — E049 Nontoxic goiter, unspecified: Secondary | ICD-10-CM

## 2014-03-13 DIAGNOSIS — R1310 Dysphagia, unspecified: Secondary | ICD-10-CM | POA: Insufficient documentation

## 2014-03-13 DIAGNOSIS — E041 Nontoxic single thyroid nodule: Secondary | ICD-10-CM | POA: Insufficient documentation

## 2014-03-13 DIAGNOSIS — R599 Enlarged lymph nodes, unspecified: Secondary | ICD-10-CM | POA: Insufficient documentation

## 2014-03-13 MED ORDER — GLIPIZIDE 5 MG PO TABS: 5.0000 mg | ORAL_TABLET | Freq: Two times a day (BID) | ORAL | Status: DC

## 2014-03-13 NOTE — Telephone Encounter (Signed)
Relevant patient education mailed to patient.  

## 2014-03-14 ENCOUNTER — Telehealth: Payer: Self-pay | Admitting: Family

## 2014-03-14 ENCOUNTER — Other Ambulatory Visit: Payer: Self-pay | Admitting: Family

## 2014-03-14 DIAGNOSIS — E079 Disorder of thyroid, unspecified: Secondary | ICD-10-CM

## 2014-03-14 NOTE — Telephone Encounter (Signed)
I have attempted to reach pt several times. Left a detailed message to advise of labs, [place] and referrals

## 2014-03-14 NOTE — Telephone Encounter (Signed)
Please call pt about results

## 2014-03-27 ENCOUNTER — Other Ambulatory Visit (HOSPITAL_COMMUNITY)
Admission: RE | Admit: 2014-03-27 | Discharge: 2014-03-27 | Disposition: A | Discharge status: Home / Self Care | Payer: Medicare Other | Source: Ambulatory Visit | Attending: Interventional Radiology | Admitting: Interventional Radiology

## 2014-03-27 ENCOUNTER — Ambulatory Visit
Admission: RE | Admit: 2014-03-27 | Discharge: 2014-03-27 | Disposition: A | Discharge status: Home / Self Care | Payer: Medicare Other | Source: Ambulatory Visit | Attending: Family | Admitting: Family

## 2014-03-27 DIAGNOSIS — E079 Disorder of thyroid, unspecified: Secondary | ICD-10-CM | POA: Insufficient documentation

## 2014-04-09 ENCOUNTER — Encounter: Payer: Self-pay | Admitting: Family

## 2014-04-09 ENCOUNTER — Ambulatory Visit (INDEPENDENT_AMBULATORY_CARE_PROVIDER_SITE_OTHER): Payer: Medicare Other | Admitting: Family

## 2014-04-09 VITALS — BP 140/86 | HR 99 | Ht 62.0 in | Wt 150.0 lb

## 2014-04-09 DIAGNOSIS — E1349 Other specified diabetes mellitus with other diabetic neurological complication: Secondary | ICD-10-CM

## 2014-04-09 DIAGNOSIS — G988 Other disorders of nervous system: Secondary | ICD-10-CM

## 2014-04-09 DIAGNOSIS — IMO0001 Reserved for inherently not codable concepts without codable children: Secondary | ICD-10-CM

## 2014-04-09 DIAGNOSIS — I10 Essential (primary) hypertension: Secondary | ICD-10-CM

## 2014-04-09 DIAGNOSIS — E1165 Type 2 diabetes mellitus with hyperglycemia: Principal | ICD-10-CM

## 2014-04-09 DIAGNOSIS — E0849 Diabetes mellitus due to underlying condition with other diabetic neurological complication: Secondary | ICD-10-CM

## 2014-04-09 MED ORDER — GLIPIZIDE 5 MG PO TABS: 5.0000 mg | ORAL_TABLET | Freq: Two times a day (BID) | ORAL | Status: AC

## 2014-04-09 NOTE — Progress Notes (Signed)
Pre visit review using our clinic review tool, if applicable. No additional management support is needed unless otherwise documented below in the visit note. 

## 2014-04-09 NOTE — Progress Notes (Signed)
Subjective:    Patient ID: Brooke Wilcox, female    DOB: 10-Jun-1946, 68 y.o.   MRN: [id number]  HPI 68 year old [place] American female presents for T2DM chronic management follow up. She was unable to purchase glipizide after the last visit, although she has been compliant with Metformin. She denies abdominal pain or GI upset. She has not been checking her blood sugars at home, reports her diet has not been controlled, and she has not been able to exercise. 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    Review of Systems  Constitutional: Negative.   HENT: Negative.   Eyes: Negative.   Respiratory: Negative.   Cardiovascular: Negative.   Gastrointestinal: Negative.   Endocrine: Negative.   Genitourinary: Negative.   Musculoskeletal:       Intermittent burning sensation in thighs, relieved with gabapentin  Skin: Negative.   Allergic/Immunologic: Negative.   Neurological: Negative.   Hematological: Negative.   Psychiatric/Behavioral: Negative.    Past Medical History  Diagnosis Date  . Hypertension   . Diabetes mellitus without complication   . Dysphagia   . Pelvic pain   . Fibroids     uterine  . Depression   . Compression fracture of thoracic vertebra 1970's    T7  . Goiter     left side, per pt, which contributes to her dysphagia    History   Social History  . Marital Status: Divorced    Spouse Name: N/A    Number of Children: 3  . Years of Education: N/A   Occupational History  . unemployed    Social History Main Topics  . Smoking status: Current Some Day Smoker    Types: Cigarettes  . Smokeless tobacco: Not on file     Comment: sporadic smoking, sometimes daily  . Alcohol Use: Yes     Comment: occasional glass of wine.  . Drug Use: No  . Sexual Activity: Not on file   Other Topics Concern  . Not on file   Social History Narrative   Divorced.  Staying in a room in someone else's house. Her children all live in [place].  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          Past Surgical History  Procedure Laterality Date  . Bone chip removal from l knee    . Right oophorectomy    . Right wrist fracture post orif   2001  . Ovarian cyst removal  1960's    Family History  Problem Relation Age of Onset  . Diabetes Mother     borderline  . Breast cancer Mother 109    late 61's  . Hypertension Mother   . Hypothyroidism Mother   . Cancer Father   . Hypertension Brother   . Cancer Daughter     throat cancer  . Lupus Daughter   . Hypertension Brother   . Crohn's disease Sister   . Hypertension Son   . Diabetes Paternal Aunt   . Cancer Paternal Uncle     lung  . Hypertension Daughter     Allergies  Allergen Reactions  . Codeine Hives    And SOB  . Latex Rash    And dizziness.    Current Outpatient Prescriptions on File Prior to Visit  Medication Sig Dispense Refill  . amLODipine (NORVASC) 10  MG tablet Take 1 tablet (10 mg total) by mouth daily. Start at 1/2 tablet, and increase to full tablet after a week if BP still >140/90  90 tablet  0  . cholecalciferol (VITAMIN D) 1000 UNITS tablet Take 1,000 Units by mouth daily.      Marland Kitchen gabapentin (NEURONTIN) 300 MG capsule Take 1 capsule (300 mg total) by mouth 3 (three) times daily.  270 capsule  0  . lisinopril-hydrochlorothiazide (PRINZIDE,ZESTORETIC) 20-25 MG per tablet Take 1 tablet by mouth daily.  90 tablet  0  . metFORMIN (GLUCOPHAGE) 500 MG tablet Take 2 tablets (1,000 mg total) by mouth 2 (two) times daily with a meal.  360 tablet  0  . pioglitazone (ACTOS) 15 MG tablet Take 1 tablet (15 mg total) by mouth daily.  30 tablet  2   No current facility-administered medications on file prior to visit.    BP 140/86  Pulse 99  Ht 5\' 2"  (1.575 m)  Wt 150 lb (68.04 kg)  BMI 27.43 kg/m2  SpO2 98%chart    Objective:   Physical  Exam  Constitutional: She is oriented to person, place, and time. She appears well-developed and well-nourished.  HENT:  Head: Normocephalic and atraumatic.  Neck: Normal range of motion.  Cardiovascular: Normal rate, regular rhythm and normal heart sounds.   Pulmonary/Chest: Effort normal and breath sounds normal.  Abdominal: Soft.  Musculoskeletal: Normal range of motion. She exhibits no edema.  Neurological: She is alert and oriented to person, place, and time.  Skin: Skin is warm and dry. She is not diaphoretic.  Psychiatric: Her behavior is normal. Judgment and thought content normal.  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 Recheck for CPX in 3 months and sooner as needed.

## 2014-04-09 NOTE — Patient Instructions (Signed)
Diabetic Neuropathy Diabetic neuropathy is a nerve disease or nerve damage that is caused by diabetes mellitus. About half of all people with diabetes mellitus have some form of nerve damage. Nerve damage is more common in those who have had diabetes mellitus for many years and who generally have not had good control of their blood sugar (glucose) level. Diabetic neuropathy is a common complication of diabetes mellitus. There are three more common types of diabetic neuropathy and a fourth type that is less common and less understood:   Peripheral neuropathy--This is the most common type of diabetic neuropathy. It causes damage to the nerves of the feet and legs first and then eventually the hands and arms.The damage affects the ability to sense touch.  Autonomic neuropathy--This type causes damage to the autonomic nervous system, which controls the following functions:  Heartbeat.  Body temperature.  Blood pressure.  Urination.  Digestion.  Sweating.  Sexual function.  Focal neuropathy--Focal neuropathy can be painful and unpredictable and occurs most often in older adults with diabetes mellitus. It involves a specific nerve or one area and often comes on suddenly. It usually does not cause long-term problems.  Radiculoplexus neuropathy-- Sometimes called lumbosacral radiculoplexus neuropathy, radiculoplexus neuropathy affects the nerves of the thighs, hips, buttocks, or legs. It is more common in people with type 2 diabetes mellitus and in older men. It is characterized by debilitating pain, weakness, and atrophy, usually in the thigh muscles. CAUSES  The cause of peripheral, autonomic, and focal neuropathies is diabetes mellitus that is uncontrolled and high glucose levels. The cause of radiculoplexus neuropathy is unknown. However, it is thought to be caused by inflammation related to uncontrolled glucose levels. SIGNS AND SYMPTOMS  Peripheral Neuropathy Peripheral neuropathy develops  slowly over time. When the nerves of the feet and legs no longer work there may be:   Burning, stabbing, or aching pain in the legs or feet.  Inability to feel pressure or pain in your feet. This can lead to:  Thick calluses over pressure areas.  Pressure sores.  Ulcers.  Foot deformities.  Reduced ability to feel temperature changes.  Muscle weakness. Autonomic Neuropathy The symptoms of autonomic neuropathy vary depending on which nerves are affected. Symptoms may include:  Problems with digestion, such as:  Feeling sick to your stomach (nausea).  Vomiting.  Bloating.  Constipation.  Diarrhea.  Abdominal pain.  Difficulty with urination. This occurs if you lose your ability to sense when your bladder is full. Problems include:  Urine leakage (incontinence).  Inability to empty your bladder completely (retention).  Rapid or irregular heartbeat (palpitations).  Blood pressure drops when you stand up (orthostatic hypotension). When you stand up you may feel:  Dizzy.  Weak.  Faint.  In men, inability to attain and maintain an erection.  In women, vaginal dryness and problems with decreased sexual desire and arousal.  Problems with body temperature regulation.  Increased or decreased sweating. Focal Neuropathy  Abnormal eye movements or abnormal alignment of both eyes.  Weakness in the wrist.  Foot drop. This results in an inability to lift the foot properly and abnormal walking or foot movement.  Paralysis on one side of your face (Bell palsy).  Chest or abdominal pain. Radiculoplexus Neuropathy  Sudden, severe pain in your hip, thigh, or buttocks.  Weakness and wasting of thigh muscles.  Difficulty rising from a seated position.  Abdominal swelling.  Unexplained weight loss (usually more than 10 lb [4.5 kg]). DIAGNOSIS  Peripheral Neuropathy Your senses may   be tested. Sensory function testing can be done with:  A light touch using a  monofilament.  A vibration with tuning fork.  A sharp sensation with a pin prick. Other tests that can help diagnose neuropathy are:  Nerve conduction velocity. This test checks the transmission of an electrical current through a nerve.  Electromyography. This shows how muscles respond to electrical signals transmitted by nearby nerves.  Quantitative sensory testing. This is used to assess how your nerves respond to vibrations and changes in temperature. Autonomic Neuropathy Diagnosis is often based on reported symptoms. Tell your health care Liam Cammarata if you experience:   Dizziness.   Constipation.   Diarrhea.   Inappropriate urination or inability to urinate.   Inability to get or maintain an erection.  Tests that may be done include:   Electrocardiography or Holter monitor. These are tests that can help show problems with the heart rate or heart rhythm.   An X-ray exam may be done. Focal Neuropathy Diagnosis is made based on your symptoms and what your health care Consuelo Suthers finds during your exam. Other tests may be done. They may include:  Nerve conduction velocities. This checks the transmission of electrical current through a nerve.  Electromyography. This shows how muscles respond to electrical signals transmitted by nearby nerves.  Quantitative sensory testing. This test is used to assess how your nerves respond to vibration and changes in temperature. Radiculoplexus Neuropathy  Often the first thing is to eliminate any other issue or problems that might be the cause, as there is no stick test for diagnosis.  X-ray exam of your spine and lumbar region.  Spinal tap to rule out cancer.  MRI to rule out other lesions. TREATMENT  Once nerve damage occurs, it cannot be reversed. The goal of treatment is to keep the disease or nerve damage from getting worse and affecting more nerve fibers. Controlling your blood glucose level is the key. Most people with  radiculoplexus neuropathy see at least a partial improvement over time. You will need to keep your blood glucose and HbA1c levels in the target range determined by your health care Selisa Tensley. Things that help control blood glucose levels include:   Blood glucose monitoring.   Meal planning.   Physical activity.   Diabetes medicine.  Over time, maintaining lower blood glucose levels helps lessen symptoms. Sometimes, prescription pain medicine is needed. HOME CARE INSTRUCTIONS:  Do not smoke.  Keep your blood glucose level in the range that you and your health care Eitan Doubleday have determined acceptable for you.  Keep your blood pressure level in the range that you and your health care Sharah Finnell have determined acceptable for you.  Eat a well-balanced diet.  Be active every day.  Check your feet every day. SEEK MEDICAL CARE IF:   You have burning, stabbing, or aching pain in the legs or feet.  You are unable to feel pressure or pain in your feet.  You develop problems with digestion such as:  Nausea.  Vomiting.  Bloating.  Constipation.  Diarrhea.  Abdominal pain.  You have difficulty with urination, such as:  Incontinence.  Retention.  You have palpitations.  You develop orthostatic hypotension. When you stand up you may feel:  Dizzy.  Weak.  Faint.  You cannot attain and maintain an erection (in men).  You have vaginal dryness and problems with decreased sexual desire and arousal (in women).  You have severe pain in your thighs, legs, or buttocks.  You have unexplained weight loss.   Document Released: 12/13/2001 Document Revised: 07/25/2013 Document Reviewed: 03/15/2013 [employer] Patient Information 2015 [employer]. This information is not intended to replace advice given to you by your health care Lorice Lafave. Make sure you discuss any questions you have with your health care Merwyn Hodapp. 

## 2014-05-22 ENCOUNTER — Other Ambulatory Visit: Payer: Self-pay | Admitting: Family

## 2014-05-22 DIAGNOSIS — E1140 Type 2 diabetes mellitus with diabetic neuropathy, unspecified: Secondary | ICD-10-CM

## 2014-06-10 ENCOUNTER — Encounter: Payer: Medicare Other | Admitting: Family

## 2014-06-10 DIAGNOSIS — Z0289 Encounter for other administrative examinations: Secondary | ICD-10-CM

## 2015-02-28 ENCOUNTER — Telehealth: Payer: Self-pay

## 2015-02-28 NOTE — Telephone Encounter (Signed)
Left message for pt to call back concerning need for mammogram 

## 2015-08-25 ENCOUNTER — Encounter: Payer: Self-pay | Admitting: Endocrinology

## 2015-09-16 IMAGING — RF DG ESOPHAGUS
14 of 24 series · 14 of 24 positions shown · non-contrast
Comparison: None.

CLINICAL DATA: Dysphagia

EXAM:
ESOPHOGRAM / BARIUM SWALLOW / BARIUM TABLET STUDY
TECHNIQUE: Combined double contrast and single contrast examination performed
using effervescent crystals, thick barium liquid, and thin barium
liquid. The patient was observed with fluoroscopy swallowing a 13mm
barium sulphate tablet.
FLUOROSCOPY TIME:  1 min, 29 seconds

[Series 1: run · 1 of 6 slices shown (1 of 14)]
[im 1/6]
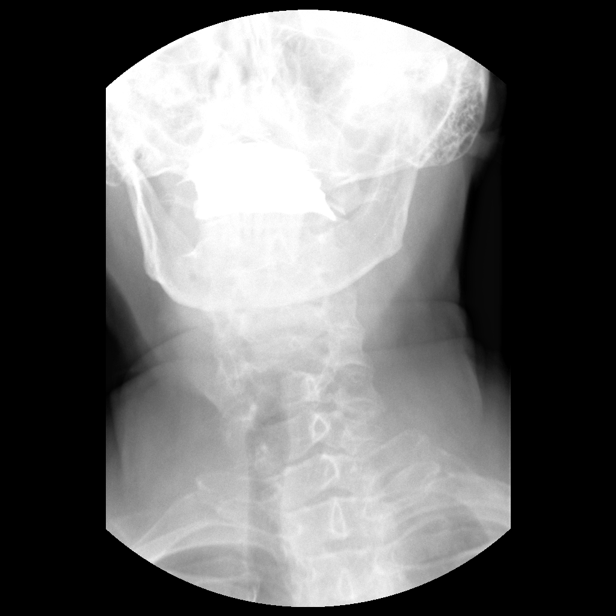

[Series 3: run · 1 of 1 slices shown (2 of 14)]
[im 1/1]
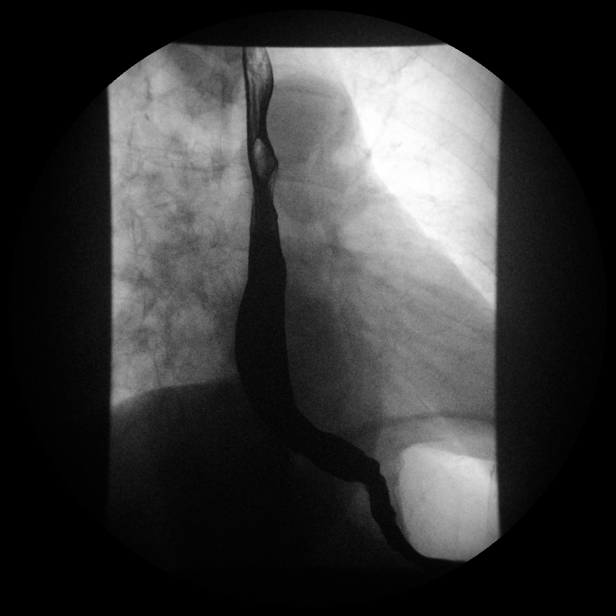

[Series 5: run · 1 of 1 slices shown (3 of 14)]
[im 1/1]
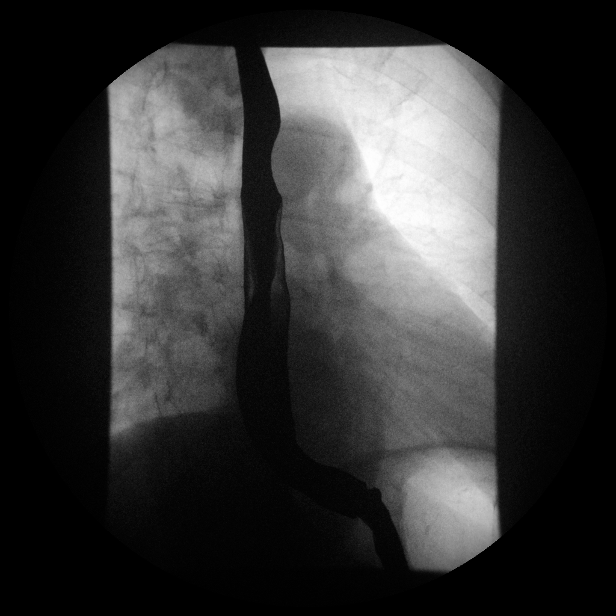

[Series 7: run · 1 of 1 slices shown (4 of 14)]
[im 1/1]
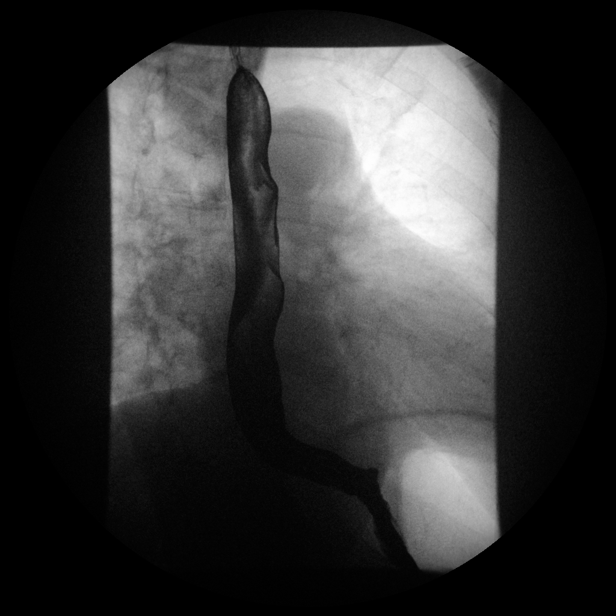

[Series 8: run · 1 of 1 slices shown (5 of 14)]
[im 1/1]
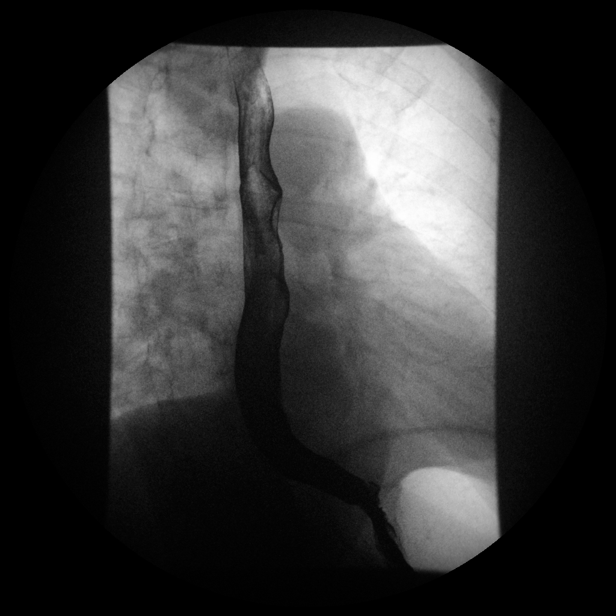

[Series 10: run · 1 of 1 slices shown (6 of 14)]
[im 1/1]
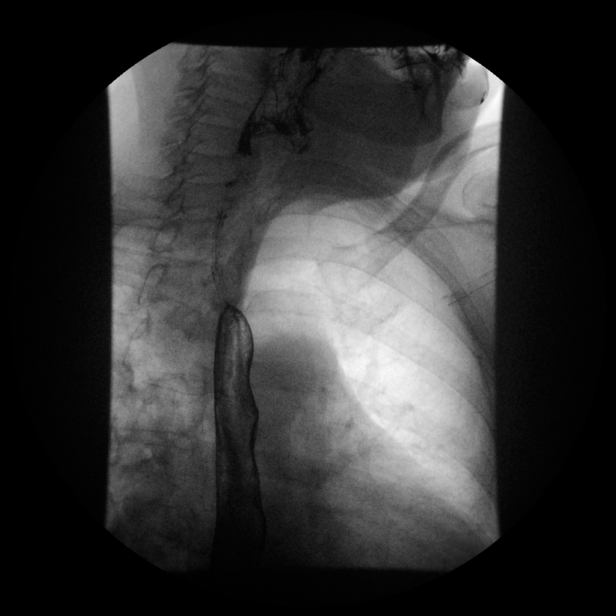

[Series 12: run · 1 of 1 slices shown (7 of 14)]
[im 1/1]
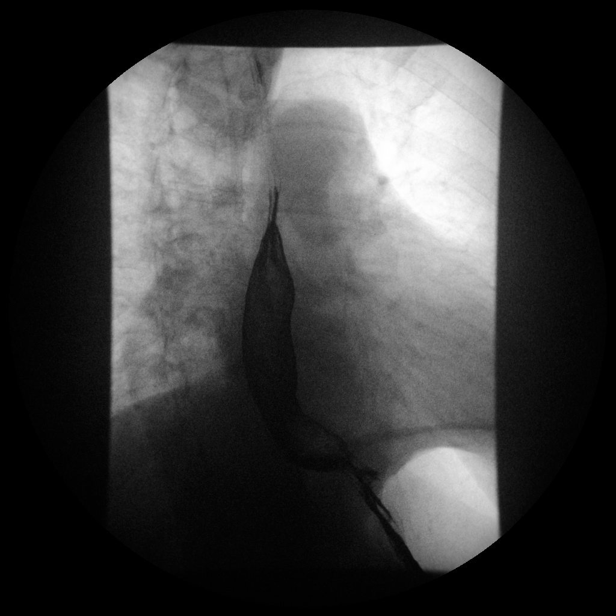

[Series 13: run · 1 of 1 slices shown (8 of 14)]
[im 1/1]
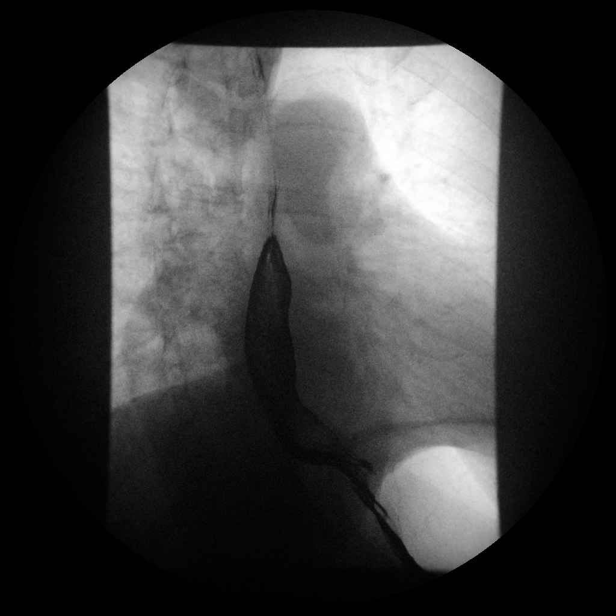

[Series 15: run · 1 of 1 slices shown (9 of 14)]
[im 1/1]
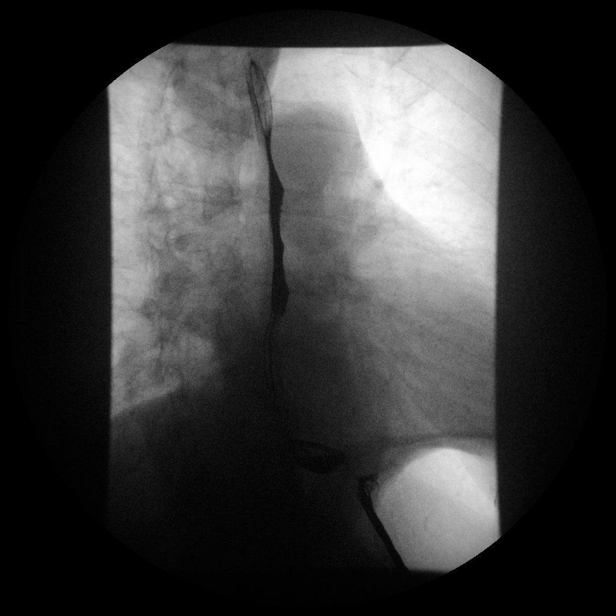

[Series 17: run · 1 of 1 slices shown (10 of 14)]
[im 1/1]
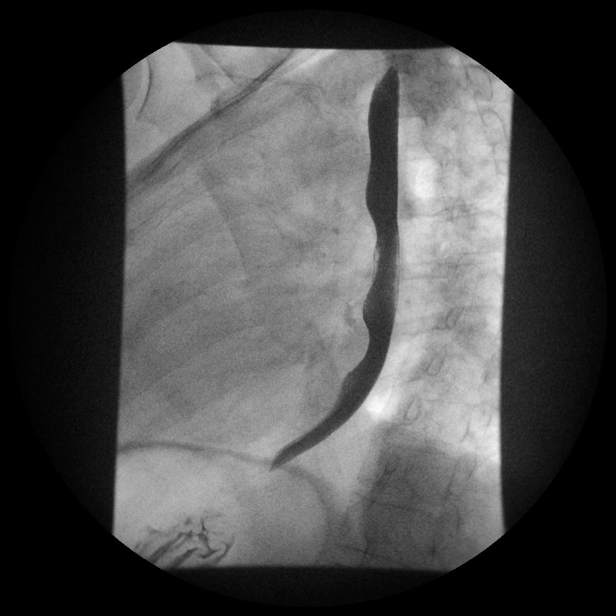

[Series 19: run · 1 of 7 slices shown (11 of 14)]
[im 1/7]
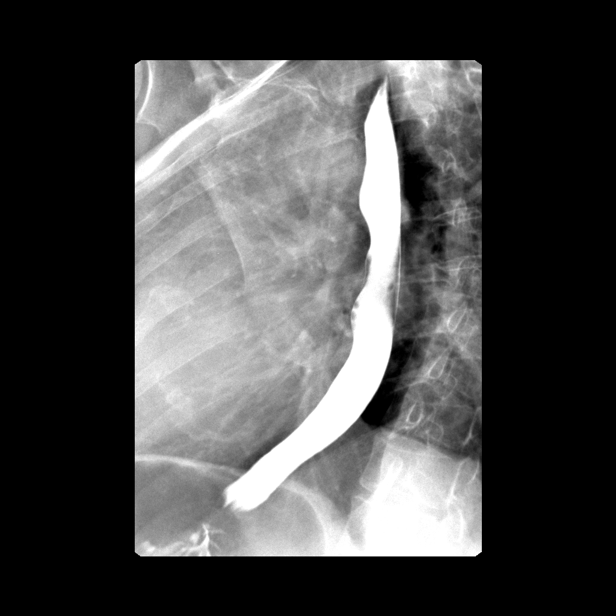

[Series 20: run · 1 of 1 slices shown (12 of 14)]
[im 1/1]
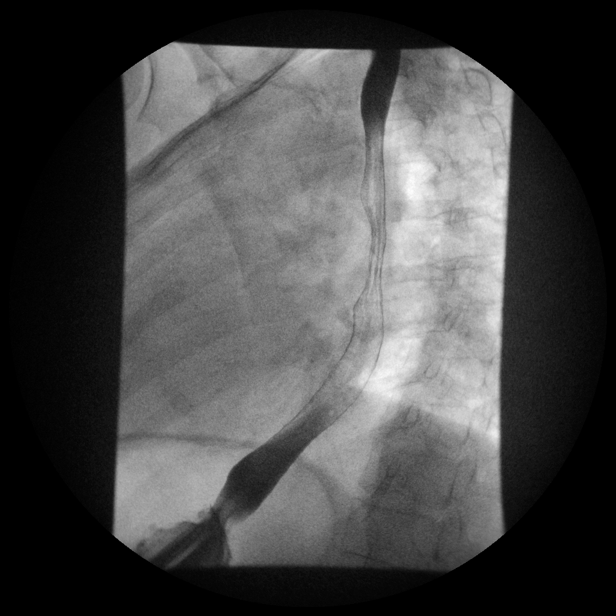

[Series 22: run · 1 of 1 slices shown (13 of 14)]
[im 1/1]
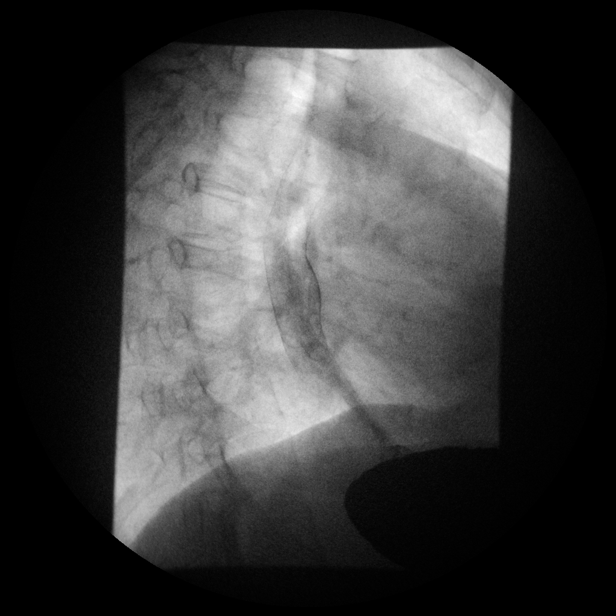

[Series 24: run · 1 of 1 slices shown (14 of 14)]
[im 1/1]
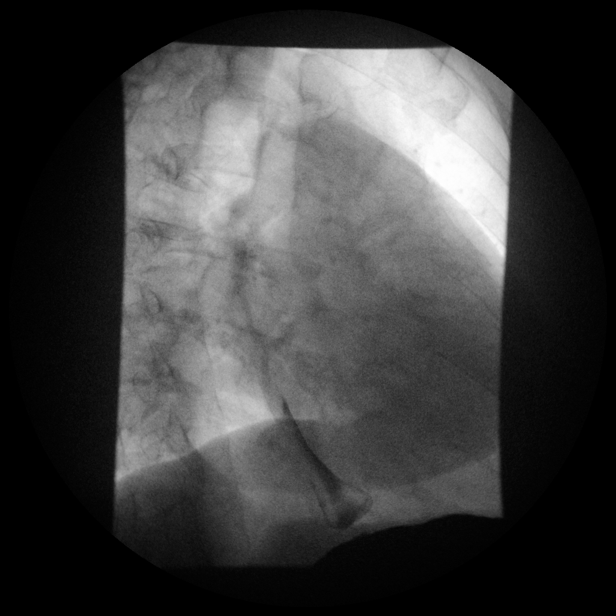

[14 of 24 positions shown; findings below may reference images not displayed]

FINDINGS: Fluoroscopic evaluation of swallowing demonstrates disruption of [DATE]
primary esophageal peristaltic waves. No tertiary contractions. No
fixed stricture, fold thickening or mass. Small amount of reflux
with the water siphon maneuver. The 13 mm barium tablet which freely
passed into the stomach. Despite this, the patient felt symptoms in
her lower neck.
IMPRESSION: Nonspecific esophageal motility disorder.

No fixed stricture.

Free passage of the barium tablet. Despite this, the patient
experienced symptoms in her lower neck.

## 2016-05-02 ENCOUNTER — Emergency Department (HOSPITAL_COMMUNITY)
Admission: EM | Admit: 2016-05-02 | Discharge: 2016-05-02 | Disposition: A | Discharge status: Home / Self Care | Payer: BLUE CROSS/BLUE SHIELD | Attending: Emergency Medicine | Admitting: Emergency Medicine

## 2016-05-02 ENCOUNTER — Emergency Department (HOSPITAL_COMMUNITY): Payer: BLUE CROSS/BLUE SHIELD

## 2016-05-02 ENCOUNTER — Encounter (HOSPITAL_COMMUNITY): Payer: Self-pay | Admitting: *Deleted

## 2016-05-02 DIAGNOSIS — Z5181 Encounter for therapeutic drug level monitoring: Secondary | ICD-10-CM | POA: Insufficient documentation

## 2016-05-02 DIAGNOSIS — H532 Diplopia: Secondary | ICD-10-CM | POA: Diagnosis not present

## 2016-05-02 DIAGNOSIS — Z91040 Latex allergy status: Secondary | ICD-10-CM | POA: Insufficient documentation

## 2016-05-02 DIAGNOSIS — E119 Type 2 diabetes mellitus without complications: Secondary | ICD-10-CM | POA: Insufficient documentation

## 2016-05-02 DIAGNOSIS — F17210 Nicotine dependence, cigarettes, uncomplicated: Secondary | ICD-10-CM | POA: Diagnosis not present

## 2016-05-02 DIAGNOSIS — I10 Essential (primary) hypertension: Secondary | ICD-10-CM | POA: Insufficient documentation

## 2016-05-02 DIAGNOSIS — Z7984 Long term (current) use of oral hypoglycemic drugs: Secondary | ICD-10-CM | POA: Diagnosis not present

## 2016-05-02 DIAGNOSIS — Z79899 Other long term (current) drug therapy: Secondary | ICD-10-CM | POA: Insufficient documentation

## 2016-05-02 LAB — CBC
HEMATOCRIT: 38.2 % (ref 36.0–46.0)
Hemoglobin: 12.4 g/dL (ref 12.0–15.0)
MCH: 28.9 pg (ref 26.0–34.0)
MCHC: 32.5 g/dL (ref 30.0–36.0)
MCV: 89 fL (ref 78.0–100.0)
Platelets: 306 10*3/uL (ref 150–400)
RBC: 4.29 MIL/uL (ref 3.87–5.11)
RDW: 13.5 % (ref 11.5–15.5)
WBC: 7.8 10*3/uL (ref 4.0–10.5)

## 2016-05-02 LAB — COMPREHENSIVE METABOLIC PANEL
ALK PHOS: 50 U/L (ref 38–126)
ALT: 17 U/L (ref 14–54)
ANION GAP: 7 (ref 5–15)
AST: 20 U/L (ref 15–41)
Albumin: 3.8 g/dL (ref 3.5–5.0)
BILIRUBIN TOTAL: 0.6 mg/dL (ref 0.3–1.2)
BUN: 6 mg/dL (ref 6–20)
CALCIUM: 9.1 mg/dL (ref 8.9–10.3)
CO2: 25 mmol/L (ref 22–32)
Chloride: 106 mmol/L (ref 101–111)
Creatinine, Ser: 0.72 mg/dL (ref 0.44–1.00)
GFR calc Af Amer: >60 mL/min (ref >60)
GLUCOSE: 229 mg/dL — AB (ref 65–99)
Potassium: 3.6 mmol/L (ref 3.5–5.1)
Sodium: 138 mmol/L (ref 135–145)
TOTAL PROTEIN: 6.5 g/dL (ref 6.5–8.1)

## 2016-05-02 LAB — APTT: APTT: 29 s (ref 24–37)

## 2016-05-02 LAB — PROTIME-INR
INR: 0.98 (ref 0.00–1.49)
Prothrombin Time: 13.2 seconds (ref 11.6–15.2)

## 2016-05-02 LAB — DIFFERENTIAL
Basophils Absolute: 0 10*3/uL (ref 0.0–0.1)
Basophils Relative: 0 %
EOS PCT: 4 %
Eosinophils Absolute: 0.3 10*3/uL (ref 0.0–0.7)
LYMPHS ABS: 3.4 10*3/uL (ref 0.7–4.0)
LYMPHS PCT: 44 %
MONOS PCT: 4 %
Monocytes Absolute: 0.3 10*3/uL (ref 0.1–1.0)
NEUTROS PCT: 48 %
Neutro Abs: 3.7 10*3/uL (ref 1.7–7.7)

## 2016-05-02 LAB — CBG MONITORING, ED: GLUCOSE-CAPILLARY: 225 mg/dL — AB (ref 65–99)

## 2016-05-02 LAB — I-STAT TROPONIN, ED: TROPONIN I, POC: 0 ng/mL (ref 0.00–0.08)

## 2016-05-02 NOTE — ED Notes (Signed)
Pt is in stable condition upon d/c and ambulates from ED. 

## 2016-05-02 NOTE — ED Notes (Signed)
Pt reports having intermittent episodes of double vision since yesterday at 1600. Denies any other neuro deficits. Does have vision change at this time, grips are equal, no facial droop noted.

## 2016-05-02 NOTE — ED Provider Notes (Signed)
CSN: [id number]     Arrival date & time 05/02/16  1008 History   First MD Initiated Contact with Patient 05/02/16 1024     Chief Complaint  Patient presents with  . Diplopia     (Consider location/radiation/quality/duration/timing/severity/associated sxs/prior Treatment) HPI Comments: Pt comes in with cc of double vision. She has hx of DM, HTN and 1/2 ppd smoking. She reports that her double vision started yday afternoon and has been intermittently worse. Patient has no n/v/f/c/ focal numbness/tingling/weakness/dizziness. Pt has never had similar symptoms. She has no hx of vision problems.   ROS 10 Systems reviewed and are negative for acute change except as noted in the HPI.     The history is provided by the patient.    Past Medical History  Diagnosis Date  . Hypertension   . Diabetes mellitus without complication ([place])   . Dysphagia   . Pelvic pain   . Fibroids     uterine  . Depression   . Compression fracture of thoracic vertebra ([facility]) 1970's    T7  . Goiter     left side, per pt, which contributes to her dysphagia   Past Surgical History  Procedure Laterality Date  . Bone chip removal from l knee    . Right oophorectomy    . Right wrist fracture post orif   2001  . Ovarian cyst removal  1960's   Family History  Problem Relation Age of Onset  . Diabetes Mother     borderline  . Breast cancer Mother 3    late 72's  . Hypertension Mother   . Hypothyroidism Mother   . Cancer Father   . Hypertension Brother   . Cancer Daughter     throat cancer  . Lupus Daughter   . Hypertension Brother   . Crohn's disease Sister   . Hypertension Son   . Diabetes Paternal Aunt   . Cancer Paternal Uncle     lung  . Hypertension Daughter    Social History  Substance Use Topics  . Smoking status: Current Some Day Smoker    Types: Cigarettes  . Smokeless tobacco: None     Comment: sporadic smoking, sometimes daily  . Alcohol Use: Yes     Comment: occasional glass  of wine.   OB History    No data available     Review of Systems    Allergies  Codeine and Latex  Home Medications   Prior to Admission medications   Medication Sig Start Date End Date Taking? Authorizing Provider  amLODipine (NORVASC) 10 MG tablet Take 1 tablet (10 mg total) by mouth daily. Start at 1/2 tablet, and increase to full tablet after a week if BP still >140/90 Patient taking differently: Take 10 mg by mouth daily.  03/05/13  Yes Rita Ohara, MD  Coenzyme Q10 (CO Q 10) 10 MG CAPS Take 40 mg by mouth daily.   Yes Historical Provider, MD  gabapentin (NEURONTIN) 300 MG capsule Take 1 capsule (300 mg total) by mouth 3 (three) times daily. Patient taking differently: Take 1,200 mg by mouth at bedtime.  03/12/14  Yes Kennyth Arnold, FNP  glipiZIDE (GLUCOTROL) 5 MG tablet Take 1 tablet (5 mg total) by mouth 2 (two) times daily before a meal. Patient taking differently: Take 10 mg by mouth at bedtime.  04/09/14  Yes Kennyth Arnold, FNP  lovastatin (MEVACOR) 10 MG tablet Take 10 mg by mouth at bedtime.   Yes Historical Provider, MD  metFORMIN (GLUCOPHAGE) 500 MG tablet Take 2 tablets (1,000 mg total) by mouth 2 (two) times daily with a meal. Patient taking differently: Take 1,000 mg by mouth at bedtime.  03/05/13  Yes Rita Ohara, MD  naproxen sodium (ANAPROX) 220 MG tablet Take 220 mg by mouth at bedtime.   Yes Historical Provider, MD  lisinopril-hydrochlorothiazide (PRINZIDE,ZESTORETIC) 20-25 MG per tablet Take 1 tablet by mouth daily. Patient not taking: Reported on 05/02/2016 03/05/13   Rita Ohara, MD  pioglitazone (ACTOS) 15 MG tablet Take 1 tablet (15 mg total) by mouth daily. Patient not taking: Reported on 05/02/2016 12/14/12   Rita Ohara, MD   BP 155/87 mmHg  Pulse 97  Temp(Src) 98.4 F (36.9 C) (Oral)  Resp 18  SpO2 100% Physical Exam  Constitutional: She is oriented to person, place, and time. She appears well-developed.  HENT:  Head: Normocephalic and atraumatic.  Eyes:  Conjunctivae and EOM are normal. Pupils are equal, round, and reactive to light.  Pinhole test reveals resolution of diplopia  Neck: Normal range of motion. Neck supple.  Cardiovascular: Normal rate, regular rhythm and normal heart sounds.   Pulmonary/Chest: Effort normal and breath sounds normal. No respiratory distress.  Abdominal: Soft. Bowel sounds are normal. She exhibits no distension. There is no tenderness. There is no rebound and no guarding.  Neurological: She is alert and oriented to person, place, and time. No cranial nerve deficit. Coordination normal.  Cerebellar exam is normal (finger to nose) Sensory exam normal for bilateral upper and lower extremities - and patient is able to discriminate between sharp and dull. Motor exam is 4+/5   Skin: Skin is warm and dry.  Nursing note and vitals reviewed.   ED Course  Procedures (including critical care time) Labs Review Labs Reviewed  COMPREHENSIVE METABOLIC PANEL - Abnormal; Notable for the following:    Glucose, Bld 229 (*)    All other components within normal limits  CBG MONITORING, ED - Abnormal; Notable for the following:    Glucose-Capillary 225 (*)    All other components within normal limits  PROTIME-INR  APTT  CBC  DIFFERENTIAL  I-STAT TROPOININ, ED    Imaging Review Ct Head Wo Contrast  05/02/2016  CLINICAL DATA:  Double vision. EXAM: CT HEAD WITHOUT CONTRAST TECHNIQUE: Contiguous axial images were obtained from the base of the skull through the vertex without intravenous contrast. COMPARISON:  None. FINDINGS: Bony calvarium appears intact. No mass effect or midline shift is noted. Ventricular size is within normal limits. There is no evidence of hemorrhage or acute infarction. Small calcified meningioma is noted in left anterior parafalcine region. IMPRESSION: Small left parafalcine calcified meningioma. No other significant intracranial abnormality seen. Electronically Signed   By: Marijo Conception, M.D.   On:  05/02/2016 11:02   I have personally reviewed and evaluated these images and lab results as part of my medical decision-making.   EKG Interpretation   Date/Time:  Sunday May 02 2016 10:20:06 EDT Ventricular Rate:  92 PR Interval:    QRS Duration: 96 QT Interval:  353 QTC Calculation: 437 R Axis:   67 Text Interpretation:  Sinus rhythm Consider left atrial enlargement  Minimal ST depression, inferior leads No acute changes No significant  change since last tracing Confirmed by Kathrynn Humble, MD, Inocencia Murtaugh [phone]) on  05/02/2016 11:13:09 AM      MDM   Final diagnoses:  Diplopia    Pt comes in with cc of double vision - horizontal diplopia, binocular. Vision clears with  pinhole test.  Stroke orders started from triage - negative.  She needs to see Optho. 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    Varney Biles, MD 05/02/16 1149

## 2016-05-02 NOTE — ED Notes (Signed)
Patient transported to CT 

## 2016-05-02 NOTE — Discharge Instructions (Signed)
Your double vision is due to refractory problems.  Please see an eye specialist as soon as possible.   Diplopia Diplopia is the condition of having double vision or seeing two of a single object. There are many causes of diplopia. Some are not dangerous and can be easily corrected. Diplopia may also be a symptom of a serious medical problem. There are two types of diplopia.  Monocular diplopia. This is double vision that affects only one eye. Monocular diplopia is often caused by a clouding of the lens in your eye (cataract) or by disruptions in the way that your eye focuses light.  Binocular diplopia. This is double vision that affects both eyes. However, when you shut one eye, the double vision will go away. Binocular diplopia may be more serious. It can be caused by:  Problems with the nerves or muscles that are responsible for eye movement.  Neurologic diseases.  Thyroid problems.  Tumors.  An infection near your eyes.  A stroke. You may need to see a health care provider who specializes in eye conditions (ophthalmologist) or a nerve specialist (neurologist) to find the cause. HOME CARE INSTRUCTIONS  Tell your health care provider about any changes in your vision.  Do not drive or operate heavy machinery if diplopia interferes with your vision.  Keep all follow-up visits as directed by your health care provider. This is important. SEEK MEDICAL CARE IF:  Your diplopia gets worse.  You develop any other symptoms along with your diplopia, such as:  Weakness.  Numbness.  Headache.  Eye pain.  Clumsiness.  Nausea.  Drooping eyelids.  Abnormal movement of one of your eyes. SEEK IMMEDIATE MEDICAL CARE IF:  You have sudden vision loss.  You suddenly get a very bad headache.  You have sudden weakness or numbness.  You suddenly lose the ability to speak, understand speech, or both.   This information is not intended to replace advice given to you by your  health care provider. Make sure you discuss any questions you have with your health care provider.   Document Released: 08/05/2004 Document Revised: 02/18/2015 Document Reviewed: 08/28/2014 Elsevier Interactive Patient Education [employer].
